# Patient Record
Sex: Female | Born: 1995 | Race: Black or African American | Hispanic: No | Marital: Single | Smoking: Former smoker
Health system: Southern US, Community
[De-identification: ages and names within clinical notes are randomized; demographics above are authoritative.]

## PROBLEM LIST (undated history)

## (undated) DIAGNOSIS — J45909 Unspecified asthma, uncomplicated: Secondary | ICD-10-CM

## (undated) DIAGNOSIS — L309 Dermatitis, unspecified: Secondary | ICD-10-CM

## (undated) DIAGNOSIS — J302 Other seasonal allergic rhinitis: Secondary | ICD-10-CM

## (undated) HISTORY — PX: WISDOM TOOTH EXTRACTION: SHX21

---
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
## 2013-06-03 ENCOUNTER — Other Ambulatory Visit: Payer: Self-pay | Admitting: *Deleted

## 2013-06-03 MED ORDER — DESOGESTREL-ETHINYL ESTRADIOL 0.15-0.02/0.01 MG (21/5) PO TABS: 1.0000 | ORAL_TABLET | Freq: Every day | ORAL | Status: DC

## 2013-06-03 NOTE — Telephone Encounter (Signed)
Last AEX and refill 04/02/12 #3 mos/ 3 refills No future appt scheduled.  Will refill once. Patient will need AEX for further refills .

## 2013-08-01 ENCOUNTER — Ambulatory Visit: Payer: Self-pay | Admitting: Nurse Practitioner

## 2013-09-24 ENCOUNTER — Encounter: Payer: Self-pay | Admitting: Nurse Practitioner

## 2013-09-25 ENCOUNTER — Other Ambulatory Visit: Payer: Self-pay | Admitting: *Deleted

## 2013-09-25 NOTE — Telephone Encounter (Signed)
eScribe request from [facility] for refill on VIORELE Last filled - 06/03/13 X 1 PACK Last AEX - 04/02/12 Next AEX - pt DNKA on 09/24/13.  Pt did not reschedule.  Message given with 06/03/13 refill that AEX will be needed for more refills. RX denied.

## 2013-09-27 ENCOUNTER — Other Ambulatory Visit: Payer: Self-pay

## 2013-09-27 NOTE — Telephone Encounter (Signed)
Last AEX: 04/02/12 Last refill:06/03/13 1 pack, 0 refs Current AEX: not scheduled  Encounter 06/03/13 states no refills until AEX. Pt had a No Show on 09/24/13 with Rock NephewPatti Grubb  Rx denied

## 2013-09-30 NOTE — Progress Notes (Signed)
This encounter was created in error - please disregard.

## 2013-11-11 ENCOUNTER — Encounter: Payer: Self-pay | Admitting: Nurse Practitioner

## 2013-11-11 ENCOUNTER — Ambulatory Visit (INDEPENDENT_AMBULATORY_CARE_PROVIDER_SITE_OTHER): Payer: Managed Care, Other (non HMO) | Admitting: Nurse Practitioner

## 2013-11-11 VITALS — BP 122/78 | HR 108 | Ht 61.5 in | Wt 190.0 lb

## 2013-11-11 DIAGNOSIS — Z113 Encounter for screening for infections with a predominantly sexual mode of transmission: Secondary | ICD-10-CM

## 2013-11-11 DIAGNOSIS — Z01419 Encounter for gynecological examination (general) (routine) without abnormal findings: Secondary | ICD-10-CM

## 2013-11-11 DIAGNOSIS — Z0000 Encounter for general adult medical examination without abnormal findings: Secondary | ICD-10-CM

## 2013-11-11 LAB — POCT URINALYSIS DIPSTICK
Bilirubin, UA: NEGATIVE
Blood, UA: NEGATIVE
Glucose, UA: NEGATIVE
Ketones, UA: NEGATIVE
Leukocytes, UA: NEGATIVE
NITRITE UA: NEGATIVE
Protein, UA: NEGATIVE
Urobilinogen, UA: NEGATIVE
pH, UA: 7

## 2013-11-11 MED ORDER — DESOGESTREL-ETHINYL ESTRADIOL 0.15-0.02/0.01 MG (21/5) PO TABS: 1.0000 | ORAL_TABLET | Freq: Every day | ORAL | Status: DC

## 2013-11-11 NOTE — Patient Instructions (Signed)
General topics  Next pap or exam is  due in 1 year Take a Women's multivitamin Take 1200 mg. of calcium daily - prefer dietary If any concerns in interim to call back  Breast Self-Awareness Practicing breast self-awareness may pick up problems early, prevent significant medical complications, and possibly save your life. By practicing breast self-awareness, you can become familiar with how your breasts look and feel and if your breasts are changing. This allows you to notice changes early. It can also offer you some reassurance that your breast health is good. One way to learn what is normal for your breasts and whether your breasts are changing is to do a breast self-exam. If you find a lump or something that was not present in the past, it is best to contact your caregiver right away. Other findings that should be evaluated by your caregiver include nipple discharge, especially if it is bloody; skin changes or reddening; areas where the skin seems to be pulled in (retracted); or new lumps and bumps. Breast pain is seldom associated with cancer (malignancy), but should also be evaluated by a caregiver. BREAST SELF-EXAM The best time to examine your breasts is 5 7 days after your menstrual period is over.  ExitCare Patient Information 2013 [employer].   Exercise to Stay Healthy Exercise helps you become and stay healthy. EXERCISE IDEAS AND TIPS Choose exercises that:  You enjoy.  Fit into your day. You do not need to exercise really hard to be healthy. You can do exercises at a slow or medium level and stay healthy. You can:  Stretch before and after working out.  Try yoga, Pilates, or tai chi.  Lift weights.  Walk fast, swim, jog, run, climb stairs, bicycle, dance, or rollerskate.  Take aerobic classes. Exercises that burn about 150 calories:  Running 1  miles in 15 minutes.  Playing volleyball for 45 to 60 minutes.  Washing and waxing a car for 45 to 60  minutes.  Playing touch football for 45 minutes.  Walking 1  miles in 35 minutes.  Pushing a stroller 1  miles in 30 minutes.  Playing basketball for 30 minutes.  Raking leaves for 30 minutes.  Bicycling 5 miles in 30 minutes.  Walking 2 miles in 30 minutes.  Dancing for 30 minutes.  Shoveling snow for 15 minutes.  Swimming laps for 20 minutes.  Walking up stairs for 15 minutes.  Bicycling 4 miles in 15 minutes.  Gardening for 30 to 45 minutes.  Jumping rope for 15 minutes.  Washing windows or floors for 45 to 60 minutes. Document Released: 03/12/2010 Document Revised: 05/02/2011 Document Reviewed: 03/12/2010 ExitCare Patient Information 2013 [employer].   Other topics ( that may be useful information):    Sexually Transmitted Disease Sexually transmitted disease (STD) refers to any infection that is passed from person to person during sexual activity. This may happen by way of saliva, semen, blood, vaginal mucus, or urine. Common STDs include:  Gonorrhea.  Chlamydia.  Syphilis.  HIV/AIDS.  Genital herpes.  Hepatitis B and C.  Trichomonas.  Human papillomavirus (HPV).  Pubic lice. CAUSES  An STD may be spread by bacteria, virus, or parasite. A person can get an STD by:  Sexual intercourse with an infected person.  Sharing sex toys with an infected person.  Sharing needles with an infected person.  Having intimate contact with the genitals, mouth, or rectal areas of an infected person. SYMPTOMS  Some people may not have any symptoms, but   they can still pass the infection to others. Different STDs have different symptoms. Symptoms include:  Painful or bloody urination.  Pain in the pelvis, abdomen, vagina, anus, throat, or eyes.  Skin rash, itching, irritation, growths, or sores (lesions). These usually occur in the genital or anal area.  Abnormal vaginal discharge.  Penile discharge in men.  Soft, flesh-colored skin growths in the  genital or anal area.  Fever.  Pain or bleeding during sexual intercourse.  Swollen glands in the groin area.  Yellow skin and eyes (jaundice). This is seen with hepatitis. DIAGNOSIS  To make a diagnosis, your caregiver may:  Take a medical history.  Perform a physical exam.  Take a specimen (culture) to be examined.  Examine a sample of discharge under a microscope.  Perform blood test TREATMENT   Chlamydia, gonorrhea, trichomonas, and syphilis can be cured with antibiotic medicine.  Genital herpes, hepatitis, and HIV can be treated, but not cured, with prescribed medicines. The medicines will lessen the symptoms.  Genital warts from HPV can be treated with medicine or by freezing, burning (electrocautery), or surgery. Warts may come back.  HPV is a virus and cannot be cured with medicine or surgery.However, abnormal areas may be followed very closely by your caregiver and may be removed from the cervix, vagina, or vulva through office procedures or surgery. If your diagnosis is confirmed, your recent sexual partners need treatment. This is true even if they are symptom-free or have a negative culture or evaluation. They should not have sex until their caregiver says it is okay. HOME CARE INSTRUCTIONS  All sexual partners should be informed, tested, and treated for all STDs.  Take your antibiotics as directed. Finish them even if you start to feel better.  Only take over-the-counter or prescription medicines for pain, discomfort, or fever as directed by your caregiver.  Rest.  Eat a balanced diet and drink enough fluids to keep your urine clear or pale yellow.  Do not have sex until treatment is completed and you have followed up with your caregiver. STDs should be checked after treatment.  Keep all follow-up appointments, Pap tests, and blood tests as directed by your caregiver.  Only use latex condoms and water-soluble lubricants during sexual activity. Do not use  petroleum jelly or oils.  Avoid alcohol and illegal drugs.  Get vaccinated for HPV and hepatitis. If you have not received these vaccines in the past, talk to your caregiver about whether one or both might be right for you.  Avoid risky sex practices that can break the skin. The only way to avoid getting an STD is to avoid all sexual activity.Latex condoms and dental dams (for oral sex) will help lessen the risk of getting an STD, but will not completely eliminate the risk. SEEK MEDICAL CARE IF:   You have a fever.  You have any new or worsening symptoms. Document Released: 04/30/2002 Document Revised: 05/02/2011 Document Reviewed: 05/07/2010 [facility] Patient Information 2013 [employer].    Domestic Abuse You are being battered or abused if someone close to you hits, pushes, or physically hurts you in any way. You also are being abused if you are forced into activities. You are being sexually abused if you are forced to have sexual contact of any kind. You are being emotionally abused if you are made to feel worthless or if you are constantly threatened. It is important to remember that help is available. No one has the right to abuse you. PREVENTION OF FURTHER  ABUSE  Learn the warning signs of danger. This varies with situations but may include: the use of alcohol, threats, isolation from friends and family, or forced sexual contact. Leave if you feel that violence is going to occur.  If you are attacked or beaten, report it to the police so the abuse is documented. You do not have to press charges. The police can protect you while you or the attackers are leaving. Get the officer's name and badge number and a copy of the report.  Find someone you can trust and tell them what is happening to you: your caregiver, a nurse, clergy member, close friend or family member. Feeling ashamed is natural, but remember that you have done nothing wrong. No one deserves abuse. Document Released:  02/05/2000 Document Revised: 05/02/2011 Document Reviewed: 04/15/2010 ExitCare Patient Information 2013 [employer].    How Much is Too Much Alcohol? Drinking too much alcohol can cause injury, accidents, and health problems. These types of problems can include:   Car crashes.  Falls.  Family fighting (domestic violence).  Drowning.  Fights.  Injuries.  Burns.  Damage to certain organs.  Having a baby with birth defects. ONE DRINK CAN BE TOO MUCH WHEN YOU ARE:  Working.  Pregnant or breastfeeding.  Taking medicines. Ask your doctor.  Driving or planning to drive. If you or someone you know has a drinking problem, get help from a doctor.  Document Released: 12/04/2008 Document Revised: 05/02/2011 Document Reviewed: 12/04/2008 ExitCare Patient Information 2013 [employer].   Smoking Hazards Smoking cigarettes is extremely bad for your health. Tobacco smoke has over 200 known poisons in it. There are over 60 chemicals in tobacco smoke that cause cancer. Some of the chemicals found in cigarette smoke include:   Cyanide.  Benzene.  Formaldehyde.  Methanol (wood alcohol).  Acetylene (fuel used in welding torches).  Ammonia. Cigarette smoke also contains the poisonous gases nitrogen oxide and carbon monoxide.  Cigarette smokers have an increased risk of many serious medical problems and Smoking causes approximately:  90% of all lung cancer deaths in men.  80% of all lung cancer deaths in women.  90% of deaths from chronic obstructive lung disease. Compared with nonsmokers, smoking increases the risk of:  Coronary heart disease by 2 to 4 times.  Stroke by 2 to 4 times.  Men developing lung cancer by 23 times.  Women developing lung cancer by 13 times.  Dying from chronic obstructive lung diseases by 12 times.  . Smoking is the most preventable cause of death and disease in our society.  WHY IS SMOKING ADDICTIVE?  Nicotine is the chemical  agent in tobacco that is capable of causing addiction or dependence.  When you smoke and inhale, nicotine is absorbed rapidly into the bloodstream through your lungs. Nicotine absorbed through the lungs is capable of creating a powerful addiction. Both inhaled and non-inhaled nicotine may be addictive.  Addiction studies of cigarettes and spit tobacco show that addiction to nicotine occurs mainly during the teen years, when young people begin using tobacco products. WHAT ARE THE BENEFITS OF QUITTING?  There are many health benefits to quitting smoking.   Likelihood of developing cancer and heart disease decreases. Health improvements are seen almost immediately.  Blood pressure, pulse rate, and breathing patterns start returning to normal soon after quitting. QUITTING SMOKING   American Lung Association - 1-800-LUNGUSA  American Cancer Society - 1-800-ACS-2345 Document Released: 03/17/2004 Document Revised: 05/02/2011 Document Reviewed: 11/19/2008 ExitCare Patient Information 2013 ExitCare,   LLC.   Stress Management Stress is a state of physical or mental tension that often results from changes in your life or normal routine. Some common causes of stress are:  Death of a loved one.  Injuries or severe illnesses.  Getting fired or changing jobs.  Moving into a new home. Other causes may be:  Sexual problems.  Business or financial losses.  Taking on a large debt.  Regular conflict with someone at home or at work.  Constant tiredness from lack of sleep. It is not just bad things that are stressful. It may be stressful to:  Win the lottery.  Get married.  Buy a new car. The amount of stress that can be easily tolerated varies from person to person. Changes generally cause stress, regardless of the types of change. Too much stress can affect your health. It may lead to physical or emotional problems. Too little stress (boredom) may also become stressful. SUGGESTIONS TO  REDUCE STRESS:  Talk things over with your family and friends. It often is helpful to share your concerns and worries. If you feel your problem is serious, you may want to get help from a professional counselor.  Consider your problems one at a time instead of lumping them all together. Trying to take care of everything at once may seem impossible. List all the things you need to do and then start with the most important one. Set a goal to accomplish 2 or 3 things each day. If you expect to do too many in a single day you will naturally fail, causing you to feel even more stressed.  Do not use alcohol or drugs to relieve stress. Although you may feel better for a short time, they do not remove the problems that caused the stress. They can also be habit forming.  Exercise regularly - at least 3 times per week. Physical exercise can help to relieve that "uptight" feeling and will relax you.  The shortest distance between despair and hope is often a good night's sleep.  Go to bed and get up on time allowing yourself time for appointments without being rushed.  Take a short "time-out" period from any stressful situation that occurs during the day. Close your eyes and take some deep breaths. Starting with the muscles in your face, tense them, hold it for a few seconds, then relax. Repeat this with the muscles in your neck, shoulders, hand, stomach, back and legs.  Take good care of yourself. Eat a balanced diet and get plenty of rest.  Schedule time for having fun. Take a break from your daily routine to relax. HOME CARE INSTRUCTIONS   Call if you feel overwhelmed by your problems and feel you can no longer manage them on your own.  Return immediately if you feel like hurting yourself or someone else. Document Released: 08/03/2000 Document Revised: 05/02/2011 Document Reviewed: 03/26/2007 ExitCare Patient Information 2013 ExitCare, [employer].  

## 2013-11-11 NOTE — Progress Notes (Signed)
Patient ID: Kristina Mendoza, female   DOB: [birth year], 18 y.o.   MRN: [id number] 18 y.o. G0 Single African American Fe here for annual exam. Has been on OCP until this last month.  Ran out and will restart after next menses.  Menses for 3-4 days on OCP.  Menses off the pill is 4-5 days and increase in cramps.  Not SA since January. When wearing a sports bra she has pain in upper shoulders and neck area.  She will try to change types of sports bra.  She is working on weight loss as well to help reduce bust size.  She is size 'H'.  Patient's last menstrual period was 10/24/2013.          Sexually active: Yes.   Not currently The current method of family planning is OCP (estrogen/progesterone) and abstinence.    Exercising: Yes.    Gym/ health club routine includes cardio everyday.  On dance team at school. Smoker:  no  Health Maintenance: Pap:  never TDaP:  08/26/2005 Gardasil:  Completed 06/2011 Labs: HB:  14.1  Urine:  Negative    reports that she has never smoked. She has never used smokeless tobacco. She reports that she does not drink alcohol or use illicit drugs.  History reviewed. No pertinent past medical history.  History reviewed. No pertinent past surgical history.  Current Outpatient Prescriptions  Medication Sig Dispense Refill  . clobetasol ointment (TEMOVATE) 0.05 % Apply topically as directed.      . desogestrel-ethinyl estradiol (VIORELE) 0.15-0.02/0.01 MG (21/5) tablet Take 1 tablet by mouth daily.  1 Package  0   No current facility-administered medications for this visit.    Family History  Problem Relation Age of Onset  . Colon cancer Mother   . Colon cancer Maternal Grandmother   . Heart disease Maternal Grandmother   . Dementia Paternal Grandmother   . Deafness Paternal Grandmother   . Alzheimer's disease Other     ROS:  Pertinent items are noted in HPI.  Otherwise, a comprehensive ROS was negative.  Exam:   BP 122/78  Pulse 108  Ht 5' 1.5" (1.562 m)  Wt  190 lb (86.183 kg)  BMI 35.32 kg/m2  LMP 10/24/2013 Height: 5' 1.5" (156.2 cm)  Ht Readings from Last 3 Encounters:  11/11/13 5' 1.5" (1.562 m)    General appearance: alert, cooperative and appears stated age Head: Normocephalic, without obvious abnormality, atraumatic Neck: no adenopathy, supple, symmetrical, trachea midline and thyroid normal to inspection and palpation Lungs: clear to auscultation bilaterally Breasts: normal appearance, no masses or tenderness Heart: regular rate and rhythm Abdomen: soft, non-tender; no masses,  no organomegaly Extremities: extremities normal, atraumatic, no cyanosis or edema Skin: Skin color, texture, turgor normal. No rashes or lesions Lymph nodes: Cervical, supraclavicular, and axillary nodes normal. No abnormal inguinal nodes palpated Neurologic: Grossly normal   Pelvic: External genitalia:  no lesions              Urethra:  normal appearing urethra with no masses, tenderness or lesions              Bartholin's and Skene's: normal                 Vagina: normal appearing vagina with normal color and discharge, no lesions              Cervix: anteverted              Pap taken: No. Bimanual Exam:  Uterus:  normal size, contour, position, consistency, mobility, non-tender              Adnexa: no mass, fullness, tenderness               Rectovaginal: Confirms               Anus:  normal sphincter tone, no lesions  A:  Well Woman with normal exam  Contraception  R/O STD's  FMH: colon cancer  P:   Reviewed health and wellness pertinent to exam  Pap smear not taken today  Refill on OCP for a year  Follow up on STD's  Counseled on breast self exam, STD prevention, HIV risk factors and prevention, use and side effects of OCP's, adequate intake of calcium and vitamin D, diet and exercise return annually or prn  An After Visit Summary was printed and given to the patient.

## 2013-11-12 LAB — HEMOGLOBIN, FINGERSTICK: HEMOGLOBIN, FINGERSTICK: 14.1 g/dL (ref 12.0–16.0)

## 2013-11-12 LAB — STD PANEL
HIV 1&2 Ab, 4th Generation: NONREACTIVE
Hepatitis B Surface Ag: NEGATIVE

## 2013-11-12 NOTE — Progress Notes (Signed)
Encounter reviewed by Dr. Brook Silva.  

## 2013-11-13 LAB — IPS N GONORRHOEA AND CHLAMYDIA BY PCR

## 2014-06-30 ENCOUNTER — Other Ambulatory Visit: Payer: Self-pay | Admitting: Nurse Practitioner

## 2014-06-30 MED ORDER — DESOGESTREL-ETHINYL ESTRADIOL 0.15-0.02/0.01 MG (21/5) PO TABS: 1.0000 | ORAL_TABLET | Freq: Every day | ORAL | Status: DC

## 2014-06-30 NOTE — Telephone Encounter (Signed)
Patient notified that rx has been sent in. 

## 2014-06-30 NOTE — Telephone Encounter (Signed)
Medication refill request: Viorele  Last AEX:  11/11/13 with PG  Next AEX: 11/18/14 with PG Last MMG (if hormonal medication request): N/A Refill authorized:  Please advise.  I called pharmacy s/w pharmacist patient would have to pay out of pocket if she were to get her 3 packs which is what is remaining. We could send in one pack but then insurance wouldn't cover her the next time she gets 3 packs. Ms. Eunice BlaseDebbie can you send in new rx with refills to last patient until AEX?  Please advise.

## 2014-06-30 NOTE — Telephone Encounter (Signed)
Pt states she misplaced her pack of birth control pills and would like another pack called in.  Ph[facility]: [facility]

## 2014-10-24 ENCOUNTER — Other Ambulatory Visit: Payer: Self-pay | Admitting: Certified Nurse Midwife

## 2014-10-28 NOTE — Telephone Encounter (Signed)
Medication refill request: Viorele Last AEX:  11-11-13 Next AEX: 11-18-14 Last MMG (if hormonal medication request): N/A Refill authorized: please advise

## 2014-10-28 NOTE — Telephone Encounter (Signed)
RX request was sent to [facility] this AM- since is she of fI'll forward to Dr. Hyacinth Meeker

## 2014-10-28 NOTE — Telephone Encounter (Signed)
Patient calling requesting a refill on her birth control. Pharmacy on file is correct. She requests this "as soon as possible."

## 2014-11-18 ENCOUNTER — Encounter: Payer: Self-pay | Admitting: Nurse Practitioner

## 2014-11-18 ENCOUNTER — Ambulatory Visit (INDEPENDENT_AMBULATORY_CARE_PROVIDER_SITE_OTHER): Payer: Managed Care, Other (non HMO) | Admitting: Nurse Practitioner

## 2014-11-18 VITALS — BP 124/82 | HR 84 | Resp 16 | Ht 62.0 in | Wt 205.0 lb

## 2014-11-18 DIAGNOSIS — Z01419 Encounter for gynecological examination (general) (routine) without abnormal findings: Secondary | ICD-10-CM

## 2014-11-18 DIAGNOSIS — Z0000 Encounter for general adult medical examination without abnormal findings: Secondary | ICD-10-CM | POA: Diagnosis not present

## 2014-11-18 DIAGNOSIS — Z113 Encounter for screening for infections with a predominantly sexual mode of transmission: Secondary | ICD-10-CM | POA: Diagnosis not present

## 2014-11-18 NOTE — Patient Instructions (Signed)
General topics  Next pap or exam is  due in 1 year Take a Women's multivitamin Take 1200 mg. of calcium daily - prefer dietary If any concerns in interim to call back  Breast Self-Awareness Practicing breast self-awareness may pick up problems early, prevent significant medical complications, and possibly save your life. By practicing breast self-awareness, you can become familiar with how your breasts look and feel and if your breasts are changing. This allows you to notice changes early. It can also offer you some reassurance that your breast health is good. One way to learn what is normal for your breasts and whether your breasts are changing is to do a breast self-exam. If you find a lump or something that was not present in the past, it is best to contact your caregiver right away. Other findings that should be evaluated by your caregiver include nipple discharge, especially if it is bloody; skin changes or reddening; areas where the skin seems to be pulled in (retracted); or new lumps and bumps. Breast pain is seldom associated with cancer (malignancy), but should also be evaluated by a caregiver. BREAST SELF-EXAM The best time to examine your breasts is 5 7 days after your menstrual period is over.  ExitCare Patient Information 2013 [employer].   Exercise to Stay Healthy Exercise helps you become and stay healthy. EXERCISE IDEAS AND TIPS Choose exercises that:  You enjoy.  Fit into your day. You do not need to exercise really hard to be healthy. You can do exercises at a slow or medium level and stay healthy. You can:  Stretch before and after working out.  Try yoga, Pilates, or tai chi.  Lift weights.  Walk fast, swim, jog, run, climb stairs, bicycle, dance, or rollerskate.  Take aerobic classes. Exercises that burn about 150 calories:  Running 1  miles in 15 minutes.  Playing volleyball for 45 to 60 minutes.  Washing and waxing a car for 45 to 60  minutes.  Playing touch football for 45 minutes.  Walking 1  miles in 35 minutes.  Pushing a stroller 1  miles in 30 minutes.  Playing basketball for 30 minutes.  Raking leaves for 30 minutes.  Bicycling 5 miles in 30 minutes.  Walking 2 miles in 30 minutes.  Dancing for 30 minutes.  Shoveling snow for 15 minutes.  Swimming laps for 20 minutes.  Walking up stairs for 15 minutes.  Bicycling 4 miles in 15 minutes.  Gardening for 30 to 45 minutes.  Jumping rope for 15 minutes.  Washing windows or floors for 45 to 60 minutes. Document Released: 03/12/2010 Document Revised: 05/02/2011 Document Reviewed: 03/12/2010 ExitCare Patient Information 2013 [employer].   Other topics ( that may be useful information):    Sexually Transmitted Disease Sexually transmitted disease (STD) refers to any infection that is passed from person to person during sexual activity. This may happen by way of saliva, semen, blood, vaginal mucus, or urine. Common STDs include:  Gonorrhea.  Chlamydia.  Syphilis.  HIV/AIDS.  Genital herpes.  Hepatitis B and C.  Trichomonas.  Human papillomavirus (HPV).  Pubic lice. CAUSES  An STD may be spread by bacteria, virus, or parasite. A person can get an STD by:  Sexual intercourse with an infected person.  Sharing sex toys with an infected person.  Sharing needles with an infected person.  Having intimate contact with the genitals, mouth, or rectal areas of an infected person. SYMPTOMS  Some people may not have any symptoms, but   they can still pass the infection to others. Different STDs have different symptoms. Symptoms include:  Painful or bloody urination.  Pain in the pelvis, abdomen, vagina, anus, throat, or eyes.  Skin rash, itching, irritation, growths, or sores (lesions). These usually occur in the genital or anal area.  Abnormal vaginal discharge.  Penile discharge in men.  Soft, flesh-colored skin growths in the  genital or anal area.  Fever.  Pain or bleeding during sexual intercourse.  Swollen glands in the groin area.  Yellow skin and eyes (jaundice). This is seen with hepatitis. DIAGNOSIS  To make a diagnosis, your caregiver may:  Take a medical history.  Perform a physical exam.  Take a specimen (culture) to be examined.  Examine a sample of discharge under a microscope.  Perform blood test TREATMENT   Chlamydia, gonorrhea, trichomonas, and syphilis can be cured with antibiotic medicine.  Genital herpes, hepatitis, and HIV can be treated, but not cured, with prescribed medicines. The medicines will lessen the symptoms.  Genital warts from HPV can be treated with medicine or by freezing, burning (electrocautery), or surgery. Warts may come back.  HPV is a virus and cannot be cured with medicine or surgery.However, abnormal areas may be followed very closely by your caregiver and may be removed from the cervix, vagina, or vulva through office procedures or surgery. If your diagnosis is confirmed, your recent sexual partners need treatment. This is true even if they are symptom-free or have a negative culture or evaluation. They should not have sex until their caregiver says it is okay. HOME CARE INSTRUCTIONS  All sexual partners should be informed, tested, and treated for all STDs.  Take your antibiotics as directed. Finish them even if you start to feel better.  Only take over-the-counter or prescription medicines for pain, discomfort, or fever as directed by your caregiver.  Rest.  Eat a balanced diet and drink enough fluids to keep your urine clear or pale yellow.  Do not have sex until treatment is completed and you have followed up with your caregiver. STDs should be checked after treatment.  Keep all follow-up appointments, Pap tests, and blood tests as directed by your caregiver.  Only use latex condoms and water-soluble lubricants during sexual activity. Do not use  petroleum jelly or oils.  Avoid alcohol and illegal drugs.  Get vaccinated for HPV and hepatitis. If you have not received these vaccines in the past, talk to your caregiver about whether one or both might be right for you.  Avoid risky sex practices that can break the skin. The only way to avoid getting an STD is to avoid all sexual activity.Latex condoms and dental dams (for oral sex) will help lessen the risk of getting an STD, but will not completely eliminate the risk. SEEK MEDICAL CARE IF:   You have a fever.  You have any new or worsening symptoms. Document Released: 04/30/2002 Document Revised: 05/02/2011 Document Reviewed: 05/07/2010 [facility] Patient Information 2013 [employer].    Domestic Abuse You are being battered or abused if someone close to you hits, pushes, or physically hurts you in any way. You also are being abused if you are forced into activities. You are being sexually abused if you are forced to have sexual contact of any kind. You are being emotionally abused if you are made to feel worthless or if you are constantly threatened. It is important to remember that help is available. No one has the right to abuse you. PREVENTION OF FURTHER  ABUSE  Learn the warning signs of danger. This varies with situations but may include: the use of alcohol, threats, isolation from friends and family, or forced sexual contact. Leave if you feel that violence is going to occur.  If you are attacked or beaten, report it to the police so the abuse is documented. You do not have to press charges. The police can protect you while you or the attackers are leaving. Get the officer's name and badge number and a copy of the report.  Find someone you can trust and tell them what is happening to you: your caregiver, a nurse, clergy member, close friend or family member. Feeling ashamed is natural, but remember that you have done nothing wrong. No one deserves abuse. Document Released:  02/05/2000 Document Revised: 05/02/2011 Document Reviewed: 04/15/2010 ExitCare Patient Information 2013 [employer].    How Much is Too Much Alcohol? Drinking too much alcohol can cause injury, accidents, and health problems. These types of problems can include:   Car crashes.  Falls.  Family fighting (domestic violence).  Drowning.  Fights.  Injuries.  Burns.  Damage to certain organs.  Having a baby with birth defects. ONE DRINK CAN BE TOO MUCH WHEN YOU ARE:  Working.  Pregnant or breastfeeding.  Taking medicines. Ask your doctor.  Driving or planning to drive. If you or someone you know has a drinking problem, get help from a doctor.  Document Released: 12/04/2008 Document Revised: 05/02/2011 Document Reviewed: 12/04/2008 ExitCare Patient Information 2013 [employer].   Smoking Hazards Smoking cigarettes is extremely bad for your health. Tobacco smoke has over 200 known poisons in it. There are over 60 chemicals in tobacco smoke that cause cancer. Some of the chemicals found in cigarette smoke include:   Cyanide.  Benzene.  Formaldehyde.  Methanol (wood alcohol).  Acetylene (fuel used in welding torches).  Ammonia. Cigarette smoke also contains the poisonous gases nitrogen oxide and carbon monoxide.  Cigarette smokers have an increased risk of many serious medical problems and Smoking causes approximately:  90% of all lung cancer deaths in men.  80% of all lung cancer deaths in women.  90% of deaths from chronic obstructive lung disease. Compared with nonsmokers, smoking increases the risk of:  Coronary heart disease by 2 to 4 times.  Stroke by 2 to 4 times.  Men developing lung cancer by 23 times.  Women developing lung cancer by 13 times.  Dying from chronic obstructive lung diseases by 12 times.  . Smoking is the most preventable cause of death and disease in our society.  WHY IS SMOKING ADDICTIVE?  Nicotine is the chemical  agent in tobacco that is capable of causing addiction or dependence.  When you smoke and inhale, nicotine is absorbed rapidly into the bloodstream through your lungs. Nicotine absorbed through the lungs is capable of creating a powerful addiction. Both inhaled and non-inhaled nicotine may be addictive.  Addiction studies of cigarettes and spit tobacco show that addiction to nicotine occurs mainly during the teen years, when young people begin using tobacco products. WHAT ARE THE BENEFITS OF QUITTING?  There are many health benefits to quitting smoking.   Likelihood of developing cancer and heart disease decreases. Health improvements are seen almost immediately.  Blood pressure, pulse rate, and breathing patterns start returning to normal soon after quitting. QUITTING SMOKING   American Lung Association - 1-800-LUNGUSA  American Cancer Society - 1-800-ACS-2345 Document Released: 03/17/2004 Document Revised: 05/02/2011 Document Reviewed: 11/19/2008 ExitCare Patient Information 2013 ExitCare,   LLC.   Stress Management Stress is a state of physical or mental tension that often results from changes in your life or normal routine. Some common causes of stress are:  Death of a loved one.  Injuries or severe illnesses.  Getting fired or changing jobs.  Moving into a new home. Other causes may be:  Sexual problems.  Business or financial losses.  Taking on a large debt.  Regular conflict with someone at home or at work.  Constant tiredness from lack of sleep. It is not just bad things that are stressful. It may be stressful to:  Win the lottery.  Get married.  Buy a new car. The amount of stress that can be easily tolerated varies from person to person. Changes generally cause stress, regardless of the types of change. Too much stress can affect your health. It may lead to physical or emotional problems. Too little stress (boredom) may also become stressful. SUGGESTIONS TO  REDUCE STRESS:  Talk things over with your family and friends. It often is helpful to share your concerns and worries. If you feel your problem is serious, you may want to get help from a professional counselor.  Consider your problems one at a time instead of lumping them all together. Trying to take care of everything at once may seem impossible. List all the things you need to do and then start with the most important one. Set a goal to accomplish 2 or 3 things each day. If you expect to do too many in a single day you will naturally fail, causing you to feel even more stressed.  Do not use alcohol or drugs to relieve stress. Although you may feel better for a short time, they do not remove the problems that caused the stress. They can also be habit forming.  Exercise regularly - at least 3 times per week. Physical exercise can help to relieve that "uptight" feeling and will relax you.  The shortest distance between despair and hope is often a good night's sleep.  Go to bed and get up on time allowing yourself time for appointments without being rushed.  Take a short "time-out" period from any stressful situation that occurs during the day. Close your eyes and take some deep breaths. Starting with the muscles in your face, tense them, hold it for a few seconds, then relax. Repeat this with the muscles in your neck, shoulders, hand, stomach, back and legs.  Take good care of yourself. Eat a balanced diet and get plenty of rest.  Schedule time for having fun. Take a break from your daily routine to relax. HOME CARE INSTRUCTIONS   Call if you feel overwhelmed by your problems and feel you can no longer manage them on your own.  Return immediately if you feel like hurting yourself or someone else. Document Released: 08/03/2000 Document Revised: 05/02/2011 Document Reviewed: 03/26/2007 ExitCare Patient Information 2013 ExitCare, [employer].  

## 2014-11-18 NOTE — Progress Notes (Signed)
Patient ID: Kristina Mendoza, female   DOB: [birth year], 19 y.o.   MRN: [id number] 19 y.o. G0P0 Single  African American Fe here for annual exam. Menses 3-4 days.  Slight cramps.  Recent change in partner.  She is currently on OCP for contraception and wants to change to Nexplanon.  Her friend uses this and is pleased.  Patient's last menstrual period was 10/18/2014.          Sexually active: Yes.    The current method of family planning is OCP (estrogen/progesterone).    Exercising: Yes.    moderate- intensity Smoker:  no  Health Maintenance: Pap:  Never TDaP:  08-26-05 Gardasil completed: 2014 Labs: to be done here   reports that she has never smoked. She has never used smokeless tobacco. She reports that she drinks about 0.6 oz of alcohol per week. She reports that she does not use illicit drugs.  History reviewed. No pertinent past medical history.  History reviewed. No pertinent past surgical history.  Current Outpatient Prescriptions  Medication Sig Dispense Refill  . VIORELE 0.15-0.02/0.01 MG (21/5) tablet TAKE 1 TABLET BY MOUTH DAILY. 84 tablet 0   No current facility-administered medications for this visit.    Family History  Problem Relation Age of Onset  . Colon cancer Mother 47  . Colon cancer Maternal Grandmother 37  . Heart disease Maternal Grandmother   . Dementia Paternal Grandmother   . Deafness Paternal Grandmother   . Alzheimer's disease Other     ROS:  Pertinent items are noted in HPI.  Otherwise, a comprehensive ROS was negative.  Exam:   BP 124/82 mmHg  Pulse 84  Resp 16  Ht  (1.575 m)  Wt 205 lb (92.987 kg)  BMI 37.49 kg/m2  LMP 10/18/2014 Height:  (157.5 cm) Ht Readings from Last 3 Encounters:  11/18/14  (1.575 m)  11/11/13 5' 1.5" (1.562 m)    General appearance: alert, cooperative and appears stated age Head: Normocephalic, without obvious abnormality, atraumatic Neck: no adenopathy, supple, symmetrical, trachea midline and  thyroid normal to inspection and palpation Lungs: clear to auscultation bilaterally Breasts: normal appearance, no masses or tenderness Heart: regular rate and rhythm Abdomen: soft, non-tender; no masses,  no organomegaly Extremities: extremities normal, atraumatic, no cyanosis or edema Skin: Skin color, texture, turgor normal. No rashes or lesions Lymph nodes: Cervical, supraclavicular, and axillary nodes normal. No abnormal inguinal nodes palpated Neurologic: Grossly normal   Pelvic: External genitalia:  no lesions              Urethra:  normal appearing urethra with no masses, tenderness or lesions              Bartholin's and Skene's: normal                 Vagina: normal appearing vagina with normal color and discharge, no lesions              Cervix: anteverted              Pap taken: Yes.   Bimanual Exam:  Uterus:  normal size, contour, position, consistency, mobility, non-tender              Adnexa: no mass, fullness, tenderness               Rectovaginal: Confirms               Anus:  normal sphincter tone, no lesions  Chaperone present: yes  A:  Well Woman with normal exam  Contraceptive needs  R/O STD's  P:   Reviewed health and wellness pertinent to exam  Pap smear as above (first pap)  No refill of OCP is given as she has 3 months now.  She is given information for Nexplanon and papers to call insurance company and verify coverage.  She will need MD apt. before insertion to discuss and review.  Counseled on breast self exam, use and side effects of OCP's, adequate intake of calcium and vitamin D, diet and exercise return annually or prn  An After Visit Summary was printed and given to the patient.

## 2014-11-19 LAB — STD PANEL
HIV 1&2 Ab, 4th Generation: NONREACTIVE
Hepatitis B Surface Ag: NEGATIVE

## 2014-11-19 LAB — IPS PAP TEST WITH REFLEX TO HPV

## 2014-11-20 LAB — IPS N GONORRHOEA AND CHLAMYDIA BY PCR

## 2014-11-20 NOTE — Progress Notes (Signed)
Encounter reviewed by Dr. Brook Amundson C. Silva.  

## 2015-01-06 ENCOUNTER — Other Ambulatory Visit: Payer: Self-pay | Admitting: Nurse Practitioner

## 2015-01-06 MED ORDER — DESOGESTREL-ETHINYL ESTRADIOL 0.15-0.02/0.01 MG (21/5) PO TABS: 1.0000 | ORAL_TABLET | Freq: Every day | ORAL | Status: DC

## 2015-01-06 NOTE — Telephone Encounter (Signed)
Requesting refill for Viorele  To [facility] on [address] at [phone].

## 2015-01-06 NOTE — Telephone Encounter (Signed)
Medication refill request: Viorele Last AEX:  11-18-14 Next AEX: 11-24-15 Last MMG (if hormonal medication request): N/A Refill authorized: please advise

## 2015-06-24 ENCOUNTER — Emergency Department (HOSPITAL_COMMUNITY)
Admission: EM | Admit: 2015-06-24 | Discharge: 2015-06-25 | Disposition: A | Discharge status: Home / Self Care | Payer: Managed Care, Other (non HMO) | Attending: Emergency Medicine | Admitting: Emergency Medicine

## 2015-06-24 ENCOUNTER — Encounter (HOSPITAL_COMMUNITY): Payer: Self-pay | Admitting: Emergency Medicine

## 2015-06-24 DIAGNOSIS — Z793 Long term (current) use of hormonal contraceptives: Secondary | ICD-10-CM | POA: Insufficient documentation

## 2015-06-24 DIAGNOSIS — R0602 Shortness of breath: Secondary | ICD-10-CM | POA: Diagnosis present

## 2015-06-24 DIAGNOSIS — Z872 Personal history of diseases of the skin and subcutaneous tissue: Secondary | ICD-10-CM | POA: Insufficient documentation

## 2015-06-24 DIAGNOSIS — Z880 Allergy status to penicillin: Secondary | ICD-10-CM | POA: Insufficient documentation

## 2015-06-24 DIAGNOSIS — R000 Tachycardia, unspecified: Secondary | ICD-10-CM | POA: Insufficient documentation

## 2015-06-24 DIAGNOSIS — J45901 Unspecified asthma with (acute) exacerbation: Secondary | ICD-10-CM

## 2015-06-24 HISTORY — DX: Other seasonal allergic rhinitis: J30.2

## 2015-06-24 HISTORY — DX: Dermatitis, unspecified: L30.9

## 2015-06-24 HISTORY — DX: Unspecified asthma, uncomplicated: J45.909

## 2015-06-24 MED ORDER — IPRATROPIUM BROMIDE 0.02 % IN SOLN
1.0000 mg | Freq: Once | RESPIRATORY_TRACT | Status: AC
  Administered 2015-06-25: 1 mg via RESPIRATORY_TRACT
  Filled 2015-06-24: qty 5

## 2015-06-24 MED ORDER — ALBUTEROL (5 MG/ML) CONTINUOUS INHALATION SOLN
10.0000 mg/h | INHALATION_SOLUTION | RESPIRATORY_TRACT | Status: AC
  Administered 2015-06-25: 10 mg/h via RESPIRATORY_TRACT
  Filled 2015-06-24: qty 20

## 2015-06-24 NOTE — ED Notes (Signed)
Pt states she is having some shortness of breath that started yesterday and then got better but got bad again around 8pm tonight  Pt has asthma and recently had bronchitis  Pt states she has an inhaler but it is outdated

## 2015-06-24 NOTE — ED Provider Notes (Signed)
CSN: [id number]     Arrival date & time 06/24/15  2207 History  By signing my name below, I, Tanda Rockers, attest that this documentation has been prepared under the direction and in the presence of [employer], PA-C. Electronically Signed: Tanda Rockers, ED Scribe. 06/24/2015. 11:58 PM.   Chief Complaint  Patient presents with  . Shortness of Breath   The history is provided by the patient, medical records and a parent. No language interpreter was used.     HPI Comments: Kristina Mendoza is a 20 y.o. female with PMHx asthma, who presents to the Emergency Department complaining of gradual onset, constant, asthma exacerbation including shortness of breath that began at 8 PM tonight (approximately 4 hours ago). Pt has hx of asthma but does not have an inhaler due to it being expired. She reports having seasonal allergies and has been taking Xysal and Flonase. Pt was diagnosed with bronchitis 2 months ago. She has not had steroids recently. Pt has never had to be hospitalized or intubated for her asthma. Denies cough, congestion, fever, chills, or any other associated symptoms.    Past Medical History  Diagnosis Date  . Asthma   . Eczema   . Seasonal allergies    History reviewed. No pertinent past surgical history. Family History  Problem Relation Age of Onset  . Colon cancer Mother 64  . Colon cancer Maternal Grandmother 23  . Heart disease Maternal Grandmother   . Dementia Paternal Grandmother   . Deafness Paternal Grandmother   . Alzheimer's disease Other   . Hypertension Other   . Diabetes Other    Social History  Substance Use Topics  . Smoking status: Never Smoker   . Smokeless tobacco: Never Used  . Alcohol Use: Yes     Comment: occ   OB History    Gravida Para Term Preterm AB TAB SAB Ectopic Multiple Living   0              Review of Systems  Constitutional: Negative for fever, chills, diaphoresis, appetite change, fatigue and unexpected weight change.   HENT: Negative for congestion and mouth sores.   Eyes: Negative for visual disturbance.  Respiratory: Positive for shortness of breath and wheezing. Negative for cough and chest tightness.   Cardiovascular: Negative for chest pain.  Gastrointestinal: Negative for nausea, vomiting, abdominal pain, diarrhea and constipation.  Endocrine: Negative for polydipsia, polyphagia and polyuria.  Genitourinary: Negative for dysuria, urgency, frequency and hematuria.  Musculoskeletal: Negative for back pain and neck stiffness.  Skin: Negative for rash.  Allergic/Immunologic: Negative for immunocompromised state.  Neurological: Negative for syncope, light-headedness and headaches.  Hematological: Does not bruise/bleed easily.  Psychiatric/Behavioral: Negative for sleep disturbance. The patient is not nervous/anxious.       Allergies  Penicillins  Home Medications   Prior to Admission medications   Medication Sig Start Date End Date Taking? Authorizing Provider  desogestrel-ethinyl estradiol (VIORELE) 0.15-0.02/0.01 MG (21/5) tablet Take 1 tablet by mouth daily. 01/06/15   Ria Comment, FNP  predniSONE (DELTASONE) 20 MG tablet Take 2 tablets (40 mg total) by mouth daily. 06/25/15   Keynan Heffern, PA-C   BP 149/110 mmHg  Pulse 106  Temp(Src) 98.5 F (36.9 C) (Oral)  Resp 20  SpO2 98%  LMP 06/03/2015 (Exact Date)   Physical Exam  Constitutional: She appears well-developed and well-nourished. No distress.  Awake, alert, nontoxic appearance  HENT:  Head: Normocephalic and atraumatic.  Mouth/Throat: Oropharynx is clear and moist. No oropharyngeal  exudate.  Eyes: Conjunctivae are normal. No scleral icterus.  Neck: Normal range of motion. Neck supple.  Cardiovascular: Regular rhythm and intact distal pulses.  Tachycardia present.   Pulmonary/Chest: Accessory muscle usage present. Tachypnea noted. She is in respiratory distress. She has decreased breath sounds. She has wheezes. She has  no rhonchi. She has no rales.  Equal chest expansion Very diminished breath sounds with inspiratory and expiratory wheezes Accessory muscle usage  Abdominal: Soft. Bowel sounds are normal. She exhibits no mass. There is no tenderness. There is no rebound and no guarding.  Musculoskeletal: Normal range of motion. She exhibits no edema.  Neurological: She is alert.  Speech is clear and goal oriented Moves extremities without ataxia  Skin: Skin is warm and dry. She is not diaphoretic.  Psychiatric: She has a normal mood and affect.  Nursing note and vitals reviewed.   ED Course  Procedures (including critical care time)  DIAGNOSTIC STUDIES: Oxygen Saturation is 98% on RA, normal by my interpretation.    COORDINATION OF CARE: 11:57 PM-Discussed treatment plan which includes breathing treatment with pt at bedside and pt agreed to plan.   Labs Review Labs Reviewed  CBC - Abnormal; Notable for the following:    WBC 10.7 (*)    Platelets 407 (*)    All other components within normal limits  BASIC METABOLIC PANEL    MDM   Final diagnoses:  Asthma exacerbation    Kristina Mendoza presents with asthma exacerbation. Significant wheezing with inspiratory and expiratory breaths.  Accessory muscle usage. Will give nebulizer and steroids.  1:39 AM Patient ambulated in ED with O2 saturations maintained at 98% (though she became very tachycardic; No chest pain), no current signs of respiratory distress. Lung exam improved after nebulizer treatment. Steroids given in the ED and pt will be dc with 5 day burst. Pt states they are breathing at baseline. Pt has been instructed to continue using prescribed medications and to speak with PCP about today's exacerbation.   I personally performed the services described in this documentation, which was scribed in my presence. The recorded information has been reviewed and is accurate.   Dahlia ClientHannah Ruthel Martine, PA-C 06/25/15 [id number]  Richardean Canalavid H Yao,  MD 06/25/15 2006

## 2015-06-25 LAB — BASIC METABOLIC PANEL
Anion gap: 9 (ref 5–15)
BUN: 15 mg/dL (ref 6–20)
CHLORIDE: 106 mmol/L (ref 101–111)
CO2: 27 mmol/L (ref 22–32)
CREATININE: 0.83 mg/dL (ref 0.44–1.00)
Calcium: 9.3 mg/dL (ref 8.9–10.3)
GFR calc non Af Amer: >60 mL/min (ref >60)
Glucose, Bld: 92 mg/dL (ref 65–99)
Potassium: 3.9 mmol/L (ref 3.5–5.1)
Sodium: 142 mmol/L (ref 135–145)

## 2015-06-25 LAB — CBC
HEMATOCRIT: 42.7 % (ref 36.0–46.0)
Hemoglobin: 14.5 g/dL (ref 12.0–15.0)
MCH: 30.9 pg (ref 26.0–34.0)
MCHC: 34 g/dL (ref 30.0–36.0)
MCV: 90.9 fL (ref 78.0–100.0)
Platelets: 407 10*3/uL — ABNORMAL HIGH (ref 150–400)
RBC: 4.7 MIL/uL (ref 3.87–5.11)
RDW: 12.1 % (ref 11.5–15.5)
WBC: 10.7 10*3/uL — ABNORMAL HIGH (ref 4.0–10.5)

## 2015-06-25 MED ORDER — METHYLPREDNISOLONE SODIUM SUCC 125 MG IJ SOLR
125.0000 mg | Freq: Once | INTRAMUSCULAR | Status: AC
  Administered 2015-06-25: 125 mg via INTRAVENOUS
  Filled 2015-06-25: qty 2

## 2015-06-25 MED ORDER — PREDNISONE 20 MG PO TABS: 40.0000 mg | ORAL_TABLET | Freq: Every day | ORAL | Status: DC

## 2015-06-25 MED ORDER — ALBUTEROL SULFATE HFA 108 (90 BASE) MCG/ACT IN AERS
2.0000 | INHALATION_SPRAY | RESPIRATORY_TRACT | Status: DC | PRN
  Administered 2015-06-25: 2 via RESPIRATORY_TRACT
  Filled 2015-06-25: qty 6.7

## 2015-06-25 MED ORDER — AEROCHAMBER PLUS FLO-VU MEDIUM MISC
1.0000 | Freq: Once | Status: AC
  Administered 2015-06-25: 1
  Filled 2015-06-25 (×2): qty 1

## 2015-06-25 NOTE — Discharge Instructions (Signed)
1. Medications: albuterol, prednisone, usual home medications 2. Treatment: rest, drink plenty of fluids,  3. Follow Up: Please followup with your primary doctor in 1-2 days for discussion of your diagnoses and further evaluation after today's visit; if you do not have a primary care doctor use the resource guide provided to find one; Please return to the ER for difficulty breathing, high fevers or worsening symptoms.

## 2015-11-23 ENCOUNTER — Encounter: Payer: Self-pay | Admitting: Nurse Practitioner

## 2015-11-23 NOTE — Progress Notes (Signed)
Patient ID: Kristina Mendoza, female   DOB: [birth year], 20 y.o.   MRN: [id number]  20 y.o. G0P0000 Single  African American Fe here for annual exam.  Want to get breast reduction.  After weight loss about 1.5 yrs ago did not loose in the cup size.  Has daily neck and shoulder pain, does stretches daily.  Will try to get PT / chiropractic care.  Menses monthly lasting 3 days.  Not dating X 5 months.  Patient's last menstrual period was 11/17/2015.          Sexually active: Not Currently  The current method of family planning is OCP (estrogen/progesterone) and condoms most of the time.    Exercising: Yes.    Cardio, Dance, Muscle Strength Smoker:  no  Health Maintenance: Pap: 11/18/14, Negative TDaP: 08/26/05 Gardasil: completed 06/2011 HIV: 11/18/14 Labs: Blood drawn today.      Urine: Neg   reports that she has never smoked. She has never used smokeless tobacco. She reports that she drinks alcohol. She reports that she does not use drugs.  Past Medical History:  Diagnosis Date  . Asthma   . Eczema   . Seasonal allergies     No past surgical history on file.  Current Outpatient Prescriptions  Medication Sig Dispense Refill  . desogestrel-ethinyl estradiol (VIORELE) 0.15-0.02/0.01 MG (21/5) tablet Take 1 tablet by mouth daily. 84 tablet 3  . Multiple Vitamin (MULTIVITAMIN) tablet Take 1 tablet by mouth daily.     No current facility-administered medications for this visit.     Family History  Problem Relation Age of Onset  . Colon cancer Mother 9442  . Alzheimer's disease Other   . Hypertension Other   . Diabetes Other   . Colon cancer Maternal Grandmother [id number]  . Heart disease Maternal Grandmother   . Dementia Paternal Grandmother   . Deafness Paternal Grandmother     ROS:  Pertinent items are noted in HPI.  Otherwise, a comprehensive ROS was negative.  Exam:   BP 132/90 (BP Location: Right Arm, Patient Position: Sitting, Cuff Size: Normal)   Pulse 80   Resp 16   Ht 5' 1.5"  (1.562 m)   Wt 203 lb 12.8 oz (92.4 kg)   LMP 11/17/2015   BMI 37.88 kg/m  Height: 5' 1.5" (156.2 cm) Ht Readings from Last 3 Encounters:  11/24/15 5' 1.5" (1.562 m)  11/18/14 5\' 2"  (1.575 m)  11/11/13 5' 1.5" (1.562 m)    General appearance: alert, cooperative and appears stated age Head: Normocephalic, without obvious abnormality, atraumatic Neck: no adenopathy, supple, symmetrical, trachea midline and thyroid normal to inspection and palpation Lungs: clear to auscultation bilaterally Breasts: normal appearance, no masses or tenderness Heart: regular rate and rhythm Abdomen: soft, non-tender; no masses,  no organomegaly Extremities: extremities normal, atraumatic, no cyanosis or edema Skin: Skin color, texture, turgor normal. No rashes or lesions Lymph nodes: Cervical, supraclavicular, and axillary nodes normal. No abnormal inguinal nodes palpated Neurologic: Grossly normal   Pelvic: External genitalia:  no lesions              Urethra:  normal appearing urethra with no masses, tenderness or lesions              Bartholin's and Skene's: normal                 Vagina: normal appearing vagina with normal color and discharge, no lesions  Cervix: anteverted              Pap taken: No. Bimanual Exam:  Uterus:  normal size, contour, position, consistency, mobility, non-tender              Adnexa: no mass, fullness, tenderness               Rectovaginal: Confirms               Anus:  normal sphincter tone, no lesions  Chaperone present: yes  A:  Well Woman with normal exam  Contraceptive needs             R/O STD's  Macromastia   P:   Reviewed health and wellness pertinent to exam  Pap smear as above  Refill Viorelle for a year  Follow with STD's  Given information about Macromastia - will try good support bra, PT/ chiropractic care for neck and shoulder, pain diary.  Then after those measures will see plastic surgeon.  counseled on breast self exam, STD  prevention, HIV risk factors and prevention, use and side effects of OCP's, adequate intake of calcium and vitamin D, diet and exercise return annually or prn  An After Visit Summary was printed and given to the patient.

## 2015-11-24 ENCOUNTER — Encounter: Payer: Self-pay | Admitting: Nurse Practitioner

## 2015-11-24 ENCOUNTER — Ambulatory Visit (INDEPENDENT_AMBULATORY_CARE_PROVIDER_SITE_OTHER): Payer: Managed Care, Other (non HMO) | Admitting: Nurse Practitioner

## 2015-11-24 VITALS — BP 132/90 | HR 80 | Resp 16 | Ht 61.5 in | Wt 203.8 lb

## 2015-11-24 DIAGNOSIS — Z23 Encounter for immunization: Secondary | ICD-10-CM | POA: Diagnosis not present

## 2015-11-24 DIAGNOSIS — N62 Hypertrophy of breast: Secondary | ICD-10-CM | POA: Diagnosis not present

## 2015-11-24 DIAGNOSIS — A64 Unspecified sexually transmitted disease: Secondary | ICD-10-CM | POA: Diagnosis not present

## 2015-11-24 DIAGNOSIS — Z0000 Encounter for general adult medical examination without abnormal findings: Secondary | ICD-10-CM | POA: Diagnosis not present

## 2015-11-24 DIAGNOSIS — Z01419 Encounter for gynecological examination (general) (routine) without abnormal findings: Secondary | ICD-10-CM

## 2015-11-24 LAB — POCT URINALYSIS DIPSTICK
Blood, UA: NEGATIVE
GLUCOSE UA: NEGATIVE
KETONES UA: NEGATIVE
LEUKOCYTES UA: NEGATIVE
Nitrite, UA: NEGATIVE
Protein, UA: NEGATIVE
Urobilinogen, UA: NEGATIVE
pH, UA: 5

## 2015-11-24 MED ORDER — DESOGESTREL-ETHINYL ESTRADIOL 0.15-0.02/0.01 MG (21/5) PO TABS: 1.0000 | ORAL_TABLET | Freq: Every day | ORAL | 4 refills | Status: DC

## 2015-11-24 NOTE — Patient Instructions (Signed)
General topics  Next pap or exam is  due in 1 year Take a Women's multivitamin Take 1200 mg. of calcium daily - prefer dietary If any concerns in interim to call back  Breast Self-Awareness Practicing breast self-awareness may pick up problems early, prevent significant medical complications, and possibly save your life. By practicing breast self-awareness, you can become familiar with how your breasts look and feel and if your breasts are changing. This allows you to notice changes early. It can also offer you some reassurance that your breast health is good. One way to learn what is normal for your breasts and whether your breasts are changing is to do a breast self-exam. If you find a lump or something that was not present in the past, it is best to contact your caregiver right away. Other findings that should be evaluated by your caregiver include nipple discharge, especially if it is bloody; skin changes or reddening; areas where the skin seems to be pulled in (retracted); or new lumps and bumps. Breast pain is seldom associated with cancer (malignancy), but should also be evaluated by a caregiver. BREAST SELF-EXAM The best time to examine your breasts is 5 7 days after your menstrual period is over.  ExitCare Patient Information 2013 [employer].   Exercise to Stay Healthy Exercise helps you become and stay healthy. EXERCISE IDEAS AND TIPS Choose exercises that:  You enjoy.  Fit into your day. You do not need to exercise really hard to be healthy. You can do exercises at a slow or medium level and stay healthy. You can:  Stretch before and after working out.  Try yoga, Pilates, or tai chi.  Lift weights.  Walk fast, swim, jog, run, climb stairs, bicycle, dance, or rollerskate.  Take aerobic classes. Exercises that burn about 150 calories:  Running 1  miles in 15 minutes.  Playing volleyball for 45 to 60 minutes.  Washing and waxing a car for 45 to 60  minutes.  Playing touch football for 45 minutes.  Walking 1  miles in 35 minutes.  Pushing a stroller 1  miles in 30 minutes.  Playing basketball for 30 minutes.  Raking leaves for 30 minutes.  Bicycling 5 miles in 30 minutes.  Walking 2 miles in 30 minutes.  Dancing for 30 minutes.  Shoveling snow for 15 minutes.  Swimming laps for 20 minutes.  Walking up stairs for 15 minutes.  Bicycling 4 miles in 15 minutes.  Gardening for 30 to 45 minutes.  Jumping rope for 15 minutes.  Washing windows or floors for 45 to 60 minutes. Document Released: 03/12/2010 Document Revised: 05/02/2011 Document Reviewed: 03/12/2010 ExitCare Patient Information 2013 [employer].   Other topics ( that may be useful information):    Sexually Transmitted Disease Sexually transmitted disease (STD) refers to any infection that is passed from person to person during sexual activity. This may happen by way of saliva, semen, blood, vaginal mucus, or urine. Common STDs include:  Gonorrhea.  Chlamydia.  Syphilis.  HIV/AIDS.  Genital herpes.  Hepatitis B and C.  Trichomonas.  Human papillomavirus (HPV).  Pubic lice. CAUSES  An STD may be spread by bacteria, virus, or parasite. A person can get an STD by:  Sexual intercourse with an infected person.  Sharing sex toys with an infected person.  Sharing needles with an infected person.  Having intimate contact with the genitals, mouth, or rectal areas of an infected person. SYMPTOMS  Some people may not have any symptoms, but   they can still pass the infection to others. Different STDs have different symptoms. Symptoms include:  Painful or bloody urination.  Pain in the pelvis, abdomen, vagina, anus, throat, or eyes.  Skin rash, itching, irritation, growths, or sores (lesions). These usually occur in the genital or anal area.  Abnormal vaginal discharge.  Penile discharge in men.  Soft, flesh-colored skin growths in the  genital or anal area.  Fever.  Pain or bleeding during sexual intercourse.  Swollen glands in the groin area.  Yellow skin and eyes (jaundice). This is seen with hepatitis. DIAGNOSIS  To make a diagnosis, your caregiver may:  Take a medical history.  Perform a physical exam.  Take a specimen (culture) to be examined.  Examine a sample of discharge under a microscope.  Perform blood test TREATMENT   Chlamydia, gonorrhea, trichomonas, and syphilis can be cured with antibiotic medicine.  Genital herpes, hepatitis, and HIV can be treated, but not cured, with prescribed medicines. The medicines will lessen the symptoms.  Genital warts from HPV can be treated with medicine or by freezing, burning (electrocautery), or surgery. Warts may come back.  HPV is a virus and cannot be cured with medicine or surgery.However, abnormal areas may be followed very closely by your caregiver and may be removed from the cervix, vagina, or vulva through office procedures or surgery. If your diagnosis is confirmed, your recent sexual partners need treatment. This is true even if they are symptom-free or have a negative culture or evaluation. They should not have sex until their caregiver says it is okay. HOME CARE INSTRUCTIONS  All sexual partners should be informed, tested, and treated for all STDs.  Take your antibiotics as directed. Finish them even if you start to feel better.  Only take over-the-counter or prescription medicines for pain, discomfort, or fever as directed by your caregiver.  Rest.  Eat a balanced diet and drink enough fluids to keep your urine clear or pale yellow.  Do not have sex until treatment is completed and you have followed up with your caregiver. STDs should be checked after treatment.  Keep all follow-up appointments, Pap tests, and blood tests as directed by your caregiver.  Only use latex condoms and water-soluble lubricants during sexual activity. Do not use  petroleum jelly or oils.  Avoid alcohol and illegal drugs.  Get vaccinated for HPV and hepatitis. If you have not received these vaccines in the past, talk to your caregiver about whether one or both might be right for you.  Avoid risky sex practices that can break the skin. The only way to avoid getting an STD is to avoid all sexual activity.Latex condoms and dental dams (for oral sex) will help lessen the risk of getting an STD, but will not completely eliminate the risk. SEEK MEDICAL CARE IF:   You have a fever.  You have any new or worsening symptoms. Document Released: 04/30/2002 Document Revised: 05/02/2011 Document Reviewed: 05/07/2010 [facility] Patient Information 2013 [employer].    Domestic Abuse You are being battered or abused if someone close to you hits, pushes, or physically hurts you in any way. You also are being abused if you are forced into activities. You are being sexually abused if you are forced to have sexual contact of any kind. You are being emotionally abused if you are made to feel worthless or if you are constantly threatened. It is important to remember that help is available. No one has the right to abuse you. PREVENTION OF FURTHER  ABUSE  Learn the warning signs of danger. This varies with situations but may include: the use of alcohol, threats, isolation from friends and family, or forced sexual contact. Leave if you feel that violence is going to occur.  If you are attacked or beaten, report it to the police so the abuse is documented. You do not have to press charges. The police can protect you while you or the attackers are leaving. Get the officer's name and badge number and a copy of the report.  Find someone you can trust and tell them what is happening to you: your caregiver, a nurse, clergy member, close friend or family member. Feeling ashamed is natural, but remember that you have done nothing wrong. No one deserves abuse. Document Released:  02/05/2000 Document Revised: 05/02/2011 Document Reviewed: 04/15/2010 ExitCare Patient Information 2013 [employer].    How Much is Too Much Alcohol? Drinking too much alcohol can cause injury, accidents, and health problems. These types of problems can include:   Car crashes.  Falls.  Family fighting (domestic violence).  Drowning.  Fights.  Injuries.  Burns.  Damage to certain organs.  Having a baby with birth defects. ONE DRINK CAN BE TOO MUCH WHEN YOU ARE:  Working.  Pregnant or breastfeeding.  Taking medicines. Ask your doctor.  Driving or planning to drive. If you or someone you know has a drinking problem, get help from a doctor.  Document Released: 12/04/2008 Document Revised: 05/02/2011 Document Reviewed: 12/04/2008 ExitCare Patient Information 2013 [employer].   Smoking Hazards Smoking cigarettes is extremely bad for your health. Tobacco smoke has over 200 known poisons in it. There are over 60 chemicals in tobacco smoke that cause cancer. Some of the chemicals found in cigarette smoke include:   Cyanide.  Benzene.  Formaldehyde.  Methanol (wood alcohol).  Acetylene (fuel used in welding torches).  Ammonia. Cigarette smoke also contains the poisonous gases nitrogen oxide and carbon monoxide.  Cigarette smokers have an increased risk of many serious medical problems and Smoking causes approximately:  90% of all lung cancer deaths in men.  80% of all lung cancer deaths in women.  90% of deaths from chronic obstructive lung disease. Compared with nonsmokers, smoking increases the risk of:  Coronary heart disease by 2 to 4 times.  Stroke by 2 to 4 times.  Men developing lung cancer by 23 times.  Women developing lung cancer by 13 times.  Dying from chronic obstructive lung diseases by 12 times.  . Smoking is the most preventable cause of death and disease in our society.  WHY IS SMOKING ADDICTIVE?  Nicotine is the chemical  agent in tobacco that is capable of causing addiction or dependence.  When you smoke and inhale, nicotine is absorbed rapidly into the bloodstream through your lungs. Nicotine absorbed through the lungs is capable of creating a powerful addiction. Both inhaled and non-inhaled nicotine may be addictive.  Addiction studies of cigarettes and spit tobacco show that addiction to nicotine occurs mainly during the teen years, when young people begin using tobacco products. WHAT ARE THE BENEFITS OF QUITTING?  There are many health benefits to quitting smoking.   Likelihood of developing cancer and heart disease decreases. Health improvements are seen almost immediately.  Blood pressure, pulse rate, and breathing patterns start returning to normal soon after quitting. QUITTING SMOKING   American Lung Association - 1-800-LUNGUSA  American Cancer Society - 1-800-ACS-2345 Document Released: 03/17/2004 Document Revised: 05/02/2011 Document Reviewed: 11/19/2008 ExitCare Patient Information 2013 ExitCare,   LLC.   Stress Management Stress is a state of physical or mental tension that often results from changes in your life or normal routine. Some common causes of stress are:  Death of a loved one.  Injuries or severe illnesses.  Getting fired or changing jobs.  Moving into a new home. Other causes may be:  Sexual problems.  Business or financial losses.  Taking on a large debt.  Regular conflict with someone at home or at work.  Constant tiredness from lack of sleep. It is not just bad things that are stressful. It may be stressful to:  Win the lottery.  Get married.  Buy a new car. The amount of stress that can be easily tolerated varies from person to person. Changes generally cause stress, regardless of the types of change. Too much stress can affect your health. It may lead to physical or emotional problems. Too little stress (boredom) may also become stressful. SUGGESTIONS TO  REDUCE STRESS:  Talk things over with your family and friends. It often is helpful to share your concerns and worries. If you feel your problem is serious, you may want to get help from a professional counselor.  Consider your problems one at a time instead of lumping them all together. Trying to take care of everything at once may seem impossible. List all the things you need to do and then start with the most important one. Set a goal to accomplish 2 or 3 things each day. If you expect to do too many in a single day you will naturally fail, causing you to feel even more stressed.  Do not use alcohol or drugs to relieve stress. Although you may feel better for a short time, they do not remove the problems that caused the stress. They can also be habit forming.  Exercise regularly - at least 3 times per week. Physical exercise can help to relieve that "uptight" feeling and will relax you.  The shortest distance between despair and hope is often a good night's sleep.  Go to bed and get up on time allowing yourself time for appointments without being rushed.  Take a short "time-out" period from any stressful situation that occurs during the day. Close your eyes and take some deep breaths. Starting with the muscles in your face, tense them, hold it for a few seconds, then relax. Repeat this with the muscles in your neck, shoulders, hand, stomach, back and legs.  Take good care of yourself. Eat a balanced diet and get plenty of rest.  Schedule time for having fun. Take a break from your daily routine to relax. HOME CARE INSTRUCTIONS   Call if you feel overwhelmed by your problems and feel you can no longer manage them on your own.  Return immediately if you feel like hurting yourself or someone else. Document Released: 08/03/2000 Document Revised: 05/02/2011 Document Reviewed: 03/26/2007 ExitCare Patient Information 2013 ExitCare, [employer].  

## 2015-11-25 LAB — STD PANEL
HIV 1&2 Ab, 4th Generation: NONREACTIVE
Hepatitis B Surface Ag: NEGATIVE

## 2015-11-26 LAB — IPS N GONORRHOEA AND CHLAMYDIA BY PCR

## 2015-11-27 NOTE — Progress Notes (Signed)
Encounter reviewed by Dr. Brook Amundson C. Silva.  

## 2015-12-18 ENCOUNTER — Other Ambulatory Visit: Payer: Self-pay | Admitting: Nurse Practitioner

## 2016-11-24 ENCOUNTER — Ambulatory Visit (INDEPENDENT_AMBULATORY_CARE_PROVIDER_SITE_OTHER): Payer: 59 | Admitting: Certified Nurse Midwife

## 2016-11-24 ENCOUNTER — Encounter: Payer: Self-pay | Admitting: Certified Nurse Midwife

## 2016-11-24 VITALS — BP 116/78 | HR 68 | Resp 16 | Ht 60.75 in | Wt 200.0 lb

## 2016-11-24 DIAGNOSIS — Z3041 Encounter for surveillance of contraceptive pills: Secondary | ICD-10-CM

## 2016-11-24 DIAGNOSIS — Z01419 Encounter for gynecological examination (general) (routine) without abnormal findings: Secondary | ICD-10-CM

## 2016-11-24 DIAGNOSIS — Z800 Family history of malignant neoplasm of digestive organs: Secondary | ICD-10-CM | POA: Diagnosis not present

## 2016-11-24 MED ORDER — DESOGESTREL-ETHINYL ESTRADIOL 0.15-0.02/0.01 MG (21/5) PO TABS: 1.0000 | ORAL_TABLET | Freq: Every day | ORAL | 4 refills | Status: DC

## 2016-11-24 NOTE — Patient Instructions (Signed)
General topics  Next pap or exam is  due in 1 year Take a Women's multivitamin Take 1200 mg. of calcium daily - prefer dietary If any concerns in interim to call back  Breast Self-Awareness Practicing breast self-awareness may pick up problems early, prevent significant medical complications, and possibly save your life. By practicing breast self-awareness, you can become familiar with how your breasts look and feel and if your breasts are changing. This allows you to notice changes early. It can also offer you some reassurance that your breast health is good. One way to learn what is normal for your breasts and whether your breasts are changing is to do a breast self-exam. If you find a lump or something that was not present in the past, it is best to contact your caregiver right away. Other findings that should be evaluated by your caregiver include nipple discharge, especially if it is bloody; skin changes or reddening; areas where the skin seems to be pulled in (retracted); or new lumps and bumps. Breast pain is seldom associated with cancer (malignancy), but should also be evaluated by a caregiver. BREAST SELF-EXAM The best time to examine your breasts is 5 7 days after your menstrual period is over.  ExitCare Patient Information 2013 ExitCare[employer].   Exercise to Stay Healthy Exercise helps you become and stay healthy. EXERCISE IDEAS AND TIPS Choose exercises that:  You enjoy.  Fit into your day. You do not need to exercise really hard to be healthy. You can do exercises at a slow or medium level and stay healthy. You can:  Stretch before and after working out.  Try yoga, Pilates, or tai chi.  Lift weights.  Walk fast, swim, jog, run, climb stairs, bicycle, dance, or rollerskate.  Take aerobic classes. Exercises that burn about 150 calories:  Running 1  miles in 15 minutes.  Playing volleyball for 45 to 60 minutes.  Washing and waxing a car for 45 to 60  minutes.  Playing touch football for 45 minutes.  Walking 1  miles in 35 minutes.  Pushing a stroller 1  miles in 30 minutes.  Playing basketball for 30 minutes.  Raking leaves for 30 minutes.  Bicycling 5 miles in 30 minutes.  Walking 2 miles in 30 minutes.  Dancing for 30 minutes.  Shoveling snow for 15 minutes.  Swimming laps for 20 minutes.  Walking up stairs for 15 minutes.  Bicycling 4 miles in 15 minutes.  Gardening for 30 to 45 minutes.  Jumping rope for 15 minutes.  Washing windows or floors for 45 to 60 minutes. Document Released: 03/12/2010 Document Revised: 05/02/2011 Document Reviewed: 03/12/2010 ExitCare Patient Information 2013 ExitCare[employer].   Other topics ( that may be useful information):    Sexually Transmitted Disease Sexually transmitted disease (STD) refers to any infection that is passed from person to person during sexual activity. This may happen by way of saliva, semen, blood, vaginal mucus, or urine. Common STDs include:  Gonorrhea.  Chlamydia.  Syphilis.  HIV/AIDS.  Genital herpes.  Hepatitis B and C.  Trichomonas.  Human papillomavirus (HPV).  Pubic lice. CAUSES  An STD may be spread by bacteria, virus, or parasite. A person can get an STD by:  Sexual intercourse with an infected person.  Sharing sex toys with an infected person.  Sharing needles with an infected person.  Having intimate contact with the genitals, mouth, or rectal areas of an infected person. SYMPTOMS  Some people may not have any symptoms, but   they can still pass the infection to others. Different STDs have different symptoms. Symptoms include:  Painful or bloody urination.  Pain in the pelvis, abdomen, vagina, anus, throat, or eyes.  Skin rash, itching, irritation, growths, or sores (lesions). These usually occur in the genital or anal area.  Abnormal vaginal discharge.  Penile discharge in men.  Soft, flesh-colored skin growths in the  genital or anal area.  Fever.  Pain or bleeding during sexual intercourse.  Swollen glands in the groin area.  Yellow skin and eyes (jaundice). This is seen with hepatitis. DIAGNOSIS  To make a diagnosis, your caregiver may:  Take a medical history.  Perform a physical exam.  Take a specimen (culture) to be examined.  Examine a sample of discharge under a microscope.  Perform blood test TREATMENT   Chlamydia, gonorrhea, trichomonas, and syphilis can be cured with antibiotic medicine.  Genital herpes, hepatitis, and HIV can be treated, but not cured, with prescribed medicines. The medicines will lessen the symptoms.  Genital warts from HPV can be treated with medicine or by freezing, burning (electrocautery), or surgery. Warts may come back.  HPV is a virus and cannot be cured with medicine or surgery.However, abnormal areas may be followed very closely by your caregiver and may be removed from the cervix, vagina, or vulva through office procedures or surgery. If your diagnosis is confirmed, your recent sexual partners need treatment. This is true even if they are symptom-free or have a negative culture or evaluation. They should not have sex until their caregiver says it is okay. HOME CARE INSTRUCTIONS  All sexual partners should be informed, tested, and treated for all STDs.  Take your antibiotics as directed. Finish them even if you start to feel better.  Only take over-the-counter or prescription medicines for pain, discomfort, or fever as directed by your caregiver.  Rest.  Eat a balanced diet and drink enough fluids to keep your urine clear or pale yellow.  Do not have sex until treatment is completed and you have followed up with your caregiver. STDs should be checked after treatment.  Keep all follow-up appointments, Pap tests, and blood tests as directed by your caregiver.  Only use latex condoms and water-soluble lubricants during sexual activity. Do not use  petroleum jelly or oils.  Avoid alcohol and illegal drugs.  Get vaccinated for HPV and hepatitis. If you have not received these vaccines in the past, talk to your caregiver about whether one or both might be right for you.  Avoid risky sex practices that can break the skin. The only way to avoid getting an STD is to avoid all sexual activity.Latex condoms and dental dams (for oral sex) will help lessen the risk of getting an STD, but will not completely eliminate the risk. SEEK MEDICAL CARE IF:   You have a fever.  You have any new or worsening symptoms. Document Released: 04/30/2002 Document Revised: 05/02/2011 Document Reviewed: 05/07/2010 [facility] Patient Information 2013 [employer].    Domestic Abuse You are being battered or abused if someone close to you hits, pushes, or physically hurts you in any way. You also are being abused if you are forced into activities. You are being sexually abused if you are forced to have sexual contact of any kind. You are being emotionally abused if you are made to feel worthless or if you are constantly threatened. It is important to remember that help is available. No one has the right to abuse you. PREVENTION OF FURTHER  ABUSE  Learn the warning signs of danger. This varies with situations but may include: the use of alcohol, threats, isolation from friends and family, or forced sexual contact. Leave if you feel that violence is going to occur.  If you are attacked or beaten, report it to the police so the abuse is documented. You do not have to press charges. The police can protect you while you or the attackers are leaving. Get the officer's name and badge number and a copy of the report.  Find someone you can trust and tell them what is happening to you: your caregiver, a nurse, clergy member, close friend or family member. Feeling ashamed is natural, but remember that you have done nothing wrong. No one deserves abuse. Document Released:  02/05/2000 Document Revised: 05/02/2011 Document Reviewed: 04/15/2010 ExitCare Patient Information 2013 [employer]Care[employer].    How Much is Too Much Alcohol? Drinking too much alcohol can cause injury, accidents, and health problems. These types of problems can include:   Car crashes.  Falls.  Family fighting (domestic violence).  Drowning.  Fights.  Injuries.  Burns.  Damage to certain organs.  Having a baby with birth defects. ONE DRINK CAN BE TOO MUCH WHEN YOU ARE:  Working.  Pregnant or breastfeeding.  Taking medicines. Ask your doctor.  Driving or planning to drive. If you or someone you know has a drinking problem, get help from a doctor.  Document Released: 12/04/2008 Document Revised: 05/02/2011 Document Reviewed: 12/04/2008 ExitCare Patient Information 2013 ExitCare[employer].   Smoking Hazards Smoking cigarettes is extremely bad for your health. Tobacco smoke has over 200 known poisons in it. There are over 60 chemicals in tobacco smoke that cause cancer. Some of the chemicals found in cigarette smoke include:   Cyanide.  Benzene.  Formaldehyde.  Methanol (wood alcohol).  Acetylene (fuel used in welding torches).  Ammonia. Cigarette smoke also contains the poisonous gases nitrogen oxide and carbon monoxide.  Cigarette smokers have an increased risk of many serious medical problems and Smoking causes approximately:  90% of all lung cancer deaths in men.  80% of all lung cancer deaths in women.  90% of deaths from chronic obstructive lung disease. Compared with nonsmokers, smoking increases the risk of:  Coronary heart disease by 2 to 4 times.  Stroke by 2 to 4 times.  Men developing lung cancer by 23 times.  Women developing lung cancer by 13 times.  Dying from chronic obstructive lung diseases by 12 times.  . Smoking is the most preventable cause of death and disease in our society.  WHY IS SMOKING ADDICTIVE?  Nicotine is the chemical  agent in tobacco that is capable of causing addiction or dependence.  When you smoke and inhale, nicotine is absorbed rapidly into the bloodstream through your lungs. Nicotine absorbed through the lungs is capable of creating a powerful addiction. Both inhaled and non-inhaled nicotine may be addictive.  Addiction studies of cigarettes and spit tobacco show that addiction to nicotine occurs mainly during the teen years, when young people begin using tobacco products. WHAT ARE THE BENEFITS OF QUITTING?  There are many health benefits to quitting smoking.   Likelihood of developing cancer and heart disease decreases. Health improvements are seen almost immediately.  Blood pressure, pulse rate, and breathing patterns start returning to normal soon after quitting. QUITTING SMOKING   American Lung Association - 1-800-LUNGUSA  American Cancer Society - 1-800-ACS-2345 Document Released: 03/17/2004 Document Revised: 05/02/2011 Document Reviewed: 11/19/2008 ExitCare Patient Information 2013 ExitCare,   LLC.   Stress Management Stress is a state of physical or mental tension that often results from changes in your life or normal routine. Some common causes of stress are:  Death of a loved one.  Injuries or severe illnesses.  Getting fired or changing jobs.  Moving into a new home. Other causes may be:  Sexual problems.  Business or financial losses.  Taking on a large debt.  Regular conflict with someone at home or at work.  Constant tiredness from lack of sleep. It is not just bad things that are stressful. It may be stressful to:  Win the lottery.  Get married.  Buy a new car. The amount of stress that can be easily tolerated varies from person to person. Changes generally cause stress, regardless of the types of change. Too much stress can affect your health. It may lead to physical or emotional problems. Too little stress (boredom) may also become stressful. SUGGESTIONS TO  REDUCE STRESS:  Talk things over with your family and friends. It often is helpful to share your concerns and worries. If you feel your problem is serious, you may want to get help from a professional counselor.  Consider your problems one at a time instead of lumping them all together. Trying to take care of everything at once may seem impossible. List all the things you need to do and then start with the most important one. Set a goal to accomplish 2 or 3 things each day. If you expect to do too many in a single day you will naturally fail, causing you to feel even more stressed.  Do not use alcohol or drugs to relieve stress. Although you may feel better for a short time, they do not remove the problems that caused the stress. They can also be habit forming.  Exercise regularly - at least 3 times per week. Physical exercise can help to relieve that "uptight" feeling and will relax you.  The shortest distance between despair and hope is often a good night's sleep.  Go to bed and get up on time allowing yourself time for appointments without being rushed.  Take a short "time-out" period from any stressful situation that occurs during the day. Close your eyes and take some deep breaths. Starting with the muscles in your face, tense them, hold it for a few seconds, then relax. Repeat this with the muscles in your neck, shoulders, hand, stomach, back and legs.  Take good care of yourself. Eat a balanced diet and get plenty of rest.  Schedule time for having fun. Take a break from your daily routine to relax. HOME CARE INSTRUCTIONS   Call if you feel overwhelmed by your problems and feel you can no longer manage them on your own.  Return immediately if you feel like hurting yourself or someone else. Document Released: 08/03/2000 Document Revised: 05/02/2011 Document Reviewed: 03/26/2007 ExitCare Patient Information 2013 ExitCare, LLC.   

## 2016-11-24 NOTE — Progress Notes (Signed)
21 y.o. G0P0000 Single  African American Fe here for annual exam. Periods normal, had a change with new generic OCP. Stopped pills for one month and period normal, so restarted. Not sexually active currently. No STD screening needed. Attending college in [place] in [place]. Had ER visit for asthma, on Singulare  Now. Has been working on weight control. No other health issues now. See Dermatology for Eczema.   Patient's last menstrual period was 11/15/2016 (exact date).          Sexually active: No.  The current method of family planning is OCP (estrogen/progesterone).    Exercising: Yes.    cardio, strengthening Smoker:  no  Health Maintenance: Pap:  11-18-14 neg History of Abnormal Pap: no MMG:  none Self Breast exams: yes Colonoscopy:  none BMD:   none TDaP:  2018 Shingles: no Pneumonia: no Hep C and HIV: HIV neg 2017 Labs: none   reports that she has never smoked. She has never used smokeless tobacco. She reports that she drinks alcohol. She reports that she does not use drugs.  Past Medical History:  Diagnosis Date  . Asthma   . Eczema   . Seasonal allergies     History reviewed. No pertinent surgical history.  Current Outpatient Prescriptions  Medication Sig Dispense Refill  . desogestrel-ethinyl estradiol (VIORELE) 0.15-0.02/0.01 MG (21/5) tablet Take 1 tablet by mouth daily. 84 tablet 4  . montelukast (SINGULAIR) 10 MG tablet Take 10 mg by mouth at bedtime.    . Multiple Vitamin (MULTIVITAMIN) tablet Take 1 tablet by mouth daily.    Marland Kitchen triamcinolone cream (KENALOG) 0.5 % APPLY 1 APPLICATION TO AFFECTED AREA ONCE A DAY AS NEEDED EXTERNALLY  1   No current facility-administered medications for this visit.     Family History  Problem Relation Age of Onset  . Colon cancer Mother 4       stage II/III  . Alzheimer's disease Other   . Hypertension Other   . Diabetes Other   . Colon cancer Maternal Grandmother 63  . Heart disease Maternal Grandmother   .  Dementia Paternal Grandmother   . Deafness Paternal Grandmother   . Colon cancer Maternal Uncle 60       stage II    ROS:  Pertinent items are noted in HPI.  Otherwise, a comprehensive ROS was negative.  Exam:   BP 116/78   Pulse 68   Resp 16   Ht 5' 0.75" (1.543 m)   Wt 200 lb (90.7 kg)   LMP 11/15/2016 (Exact Date)   BMI 38.10 kg/m  Height: 5' 0.75" (154.3 cm) Ht Readings from Last 3 Encounters:  11/24/16 5' 0.75" (1.543 m)  11/24/15 5' 1.5" (1.562 m)  11/18/14  (1.575 m)    General appearance: alert, cooperative and appears stated age Head: Normocephalic, without obvious abnormality, atraumatic Neck: no adenopathy, supple, symmetrical, trachea midline and thyroid normal to inspection and palpation Lungs: clear to auscultation bilaterally Breasts: normal appearance, no masses or tenderness, No nipple retraction or dimpling, No nipple discharge or bleeding, No axillary or supraclavicular adenopathy Heart: regular rate and rhythm Abdomen: soft, non-tender; no masses,  no organomegaly Extremities: extremities normal, atraumatic, no cyanosis or edema Skin: Skin color, texture, turgor normal. No rashes or lesions Lymph nodes: Cervical, supraclavicular, and axillary nodes normal. No abnormal inguinal nodes palpated Neurologic: Grossly normal   Pelvic: External genitalia:  no lesions              Urethra:  normal appearing urethra with no masses, tenderness or lesions              Bartholin's and Skene's: normal                 Vagina: normal appearing vagina with normal color and discharge, no lesions              Cervix: no cervical motion tenderness, no lesions and nulliparous appearance              Pap taken: No. Bimanual Exam:  Uterus:  normal size, contour, position, consistency, mobility, non-tender              Adnexa: normal adnexa and no mass, fullness, tenderness               Rectovaginal: Confirms               Anus:  normal   Chaperone present: yes  A:   Well Woman with normal exam  Contraception OCP desired  Eczema under MD management, good control now  Family history of colon cancer mother age 81  P:   Reviewed health and wellness pertinent to exam  Rx Viorele see order with instructions, patient will request same generic each time if has problems with generic use again or will advise  Discussed importance of colonoscopy screening at age 45 due to family history. Patient aware.  Pap smear: no   counseled on breast self exam, STD prevention, HIV risk factors and prevention, use and side effects of OCP's, adequate intake of calcium and vitamin D, diet and exercise  return annually or prn  An After Visit Summary was printed and given to the patient.

## 2016-11-25 ENCOUNTER — Encounter: Payer: Self-pay | Admitting: Certified Nurse Midwife

## 2016-11-30 ENCOUNTER — Ambulatory Visit: Payer: Managed Care, Other (non HMO) | Admitting: Nurse Practitioner

## 2017-11-30 ENCOUNTER — Ambulatory Visit: Payer: 59 | Admitting: Certified Nurse Midwife

## 2018-01-11 ENCOUNTER — Other Ambulatory Visit: Payer: Self-pay | Admitting: Certified Nurse Midwife

## 2018-01-11 DIAGNOSIS — Z3041 Encounter for surveillance of contraceptive pills: Secondary | ICD-10-CM

## 2018-01-15 ENCOUNTER — Other Ambulatory Visit: Payer: Self-pay | Admitting: Certified Nurse Midwife

## 2018-01-15 DIAGNOSIS — Z3041 Encounter for surveillance of contraceptive pills: Secondary | ICD-10-CM

## 2018-01-15 MED ORDER — DESOGESTREL-ETHINYL ESTRADIOL 0.15-0.02/0.01 MG (21/5) PO TABS: 1.0000 | ORAL_TABLET | Freq: Every day | ORAL | 0 refills | Status: DC

## 2018-01-15 NOTE — Telephone Encounter (Signed)
Patient is going to college out of state at [employer] in [place] and has scheduled a wellness exam for 03/08/17 there. She is requesting refills on Kariva to last until then, if possible.  [facility] [phone]

## 2018-01-15 NOTE — Telephone Encounter (Signed)
Medication refill request: OCP Last AEX:  11-24-16 DL  Next AEX: patient has an appointment scheduled for 03-08-18 in [place]  Last MMG (if hormonal medication request): n/a Refill authorized: please advise.

## 2018-08-02 ENCOUNTER — Other Ambulatory Visit: Payer: Self-pay

## 2018-08-02 ENCOUNTER — Ambulatory Visit: Payer: 59 | Admitting: Certified Nurse Midwife

## 2018-08-06 ENCOUNTER — Other Ambulatory Visit: Payer: Self-pay

## 2018-08-06 ENCOUNTER — Encounter: Payer: Self-pay | Admitting: Certified Nurse Midwife

## 2018-08-06 ENCOUNTER — Other Ambulatory Visit (HOSPITAL_COMMUNITY)
Admission: RE | Admit: 2018-08-06 | Discharge: 2018-08-06 | Disposition: A | Discharge status: Home / Self Care | Payer: 59 | Source: Ambulatory Visit | Attending: Certified Nurse Midwife | Admitting: Certified Nurse Midwife

## 2018-08-06 ENCOUNTER — Ambulatory Visit (INDEPENDENT_AMBULATORY_CARE_PROVIDER_SITE_OTHER): Payer: 59 | Admitting: Certified Nurse Midwife

## 2018-08-06 VITALS — BP 124/82 | HR 70 | Temp 97.6°F | Resp 16 | Ht 61.25 in | Wt 188.0 lb

## 2018-08-06 DIAGNOSIS — Z01419 Encounter for gynecological examination (general) (routine) without abnormal findings: Secondary | ICD-10-CM | POA: Diagnosis not present

## 2018-08-06 DIAGNOSIS — Z113 Encounter for screening for infections with a predominantly sexual mode of transmission: Secondary | ICD-10-CM

## 2018-08-06 DIAGNOSIS — Z124 Encounter for screening for malignant neoplasm of cervix: Secondary | ICD-10-CM | POA: Diagnosis not present

## 2018-08-06 NOTE — Progress Notes (Signed)
23 y.o. G0P0000 Single  African American Fe here for annual exam. Periods normal no issues. Contraception OCP working well. Student  Health refilled pills. Denies headaches or warning signs with OCP use.  Have been sexually active and request STD screening. Recent graduation from college! No  Health issues today.  Patient's last menstrual period was 08/01/2018 (exact date).          Sexually active: Yes.    The current method of family planning is OCP (estrogen/progesterone).    Exercising: Yes.    kickboxing Smoker:  no  Review of Systems  Constitutional: Negative.   HENT: Negative.   Eyes: Negative.   Respiratory: Negative.   Cardiovascular: Negative.   Gastrointestinal: Negative.   Genitourinary: Negative.   Musculoskeletal: Negative.   Skin: Negative.   Neurological: Negative.   Endo/Heme/Allergies: Negative.   Psychiatric/Behavioral: Negative.     Health Maintenance: Pap:  11-18-14 neg History of Abnormal Pap: no MMG:  none Self Breast exams: yes Colonoscopy:  none BMD:   none TDaP:  2018 Shingles: no Pneumonia: no Hep C and HIV: HIV neg 2017 Labs:    reports that she has never smoked. She has never used smokeless tobacco. She reports previous alcohol use. She reports that she does not use drugs.  Past Medical History:  Diagnosis Date  . Asthma   . Eczema   . Seasonal allergies     History reviewed. No pertinent surgical history.  Current Outpatient Medications  Medication Sig Dispense Refill  . albuterol (PROVENTIL) (2.5 MG/3ML) 0.083% nebulizer solution USE 3 ML THREE TIMES A DAY AS NEEDED FOR ASTHMA EXACERBATION INHALATION    . albuterol (VENTOLIN HFA) 108 (90 Base) MCG/ACT inhaler TAKE 2 PUFFS BY MOUTH EVERY 4 HOURS AS NEEDED    . cyclobenzaprine (FLEXERIL) 10 MG tablet Take by mouth.    . desogestrel-ethinyl estradiol (VIORELE) 0.15-0.02/0.01 MG (21/5) tablet Take 1 tablet by mouth daily. 84 tablet 0  . Multiple Vitamin (MULTIVITAMIN) tablet Take 1 tablet  by mouth daily.    Marland Kitchen triamcinolone cream (KENALOG) 0.5 % APPLY 1 APPLICATION TO AFFECTED AREA ONCE A DAY AS NEEDED EXTERNALLY  1   No current facility-administered medications for this visit.     Family History  Problem Relation Age of Onset  . Colon cancer Mother 43       stage II/III  . Alzheimer's disease Other   . Hypertension Other   . Diabetes Other   . Colon cancer Maternal Grandmother 2  . Heart disease Maternal Grandmother   . Dementia Paternal Grandmother   . Deafness Paternal Grandmother   . Colon cancer Maternal Uncle 60       stage II great uncle  . Other Maternal Uncle        heart problems under age 67    ROS:  Pertinent items are noted in HPI.  Otherwise, a comprehensive ROS was negative.  Exam:   BP 136/90   Pulse 70   Temp 97.6 F (36.4 C) (Skin)   Resp 16   Ht 5' 1.25" (1.556 m)   Wt 188 lb (85.3 kg)   LMP 08/01/2018 (Exact Date)   BMI 35.23 kg/m  Height: 5' 1.25" (155.6 cm) Ht Readings from Last 3 Encounters:  08/06/18 5' 1.25" (1.556 m)  11/24/16 5' 0.75" (1.543 m)  11/24/15 5' 1.5" (1.562 m)    General appearance: alert, cooperative and appears stated age Head: Normocephalic, without obvious abnormality, atraumatic Neck: no adenopathy, supple, symmetrical, trachea midline and  thyroid normal to inspection and palpation Lungs: clear to auscultation bilaterally Breasts: normal appearance, no masses or tenderness, No nipple retraction or dimpling, No nipple discharge or bleeding, No axillary or supraclavicular adenopathy Heart: regular rate and rhythm Abdomen: soft, non-tender; no masses,  no organomegaly Extremities: extremities normal, atraumatic, no cyanosis or edema Skin: Skin color, texture, turgor normal. No rashes or lesions Lymph nodes: Cervical, supraclavicular, and axillary nodes normal. No abnormal inguinal nodes palpated Neurologic: Grossly normal   Pelvic: External genitalia:  no lesions              Urethra:  normal appearing  urethra with no masses, tenderness or lesions              Bartholin's and Skene's: normal                 Vagina: normal appearing vagina with normal color and discharge, no lesions              Cervix: no cervical motion tenderness, no lesions and nulliparous appearance              Pap taken: Yes.   Bimanual Exam:  Uterus:  normal size, contour, position, consistency, mobility, non-tender and anteverted              Adnexa: normal adnexa and no mass, fullness, tenderness               Rectovaginal: Confirms               Anus:  normal appearance  Chaperone present: yes  A:  Well Woman with normal exam  Contraception OCP ( dispensed at college)  STD screening with pap only  Overweight working on diet( just graduated!)    P:   Reviewed health and wellness pertinent to exam  Risks/benefits/warning signs of OCP given and advise if occurring.  Discussed working on portion control and food choices with regular exercises.  Pap smear: yes   counseled on breast self exam, STD prevention, HIV risk factors and prevention, use and side effects of OCP's, adequate intake of calcium and vitamin D, diet and exercise  return annually or prn  An After Visit Summary was printed and given to the patient.

## 2018-08-08 LAB — CYTOLOGY - PAP
Chlamydia: NEGATIVE
Diagnosis: NEGATIVE
Neisseria Gonorrhea: NEGATIVE
Trichomonas: NEGATIVE

## 2018-12-13 ENCOUNTER — Telehealth: Payer: Self-pay | Admitting: Certified Nurse Midwife

## 2018-12-13 NOTE — Telephone Encounter (Signed)
Patient is looking into having breast reduction surgery. Has already been seen by a plastic surgeon, but needs a second opinion from another provider and would like to know if this is something we can assist her with.

## 2018-12-14 NOTE — Telephone Encounter (Signed)
Fine to schedule consult.

## 2018-12-14 NOTE — Telephone Encounter (Signed)
Spoke with patient. Patient has seen plastic surgeon/Dr. Towanda Malkin for breast reduction consult. Patient states she will need a second opinion for her insurance.   Has discussed with Kristina Boroughs, NP in the past, see 11/24/15 AEX. Patient states her back pain is managed by her PCP. Has been getting massages, weight loss of 35-40 lbs and wearing a supportive bra.   Discussed patient f/u with PCP for second opinion since they are managing her back pain. Patient asking if she could schedule a consult? Advised I will review with Kristina Mendoza, CNM and return call. Patient agreeable.   Kristina Mendoza, CNM -please review.

## 2018-12-17 NOTE — Telephone Encounter (Signed)
Spoke with patient. OV scheduled for 10/30 at 9am with Melvia Heaps, CNM.   Routing to provider for final review. Patient is agreeable to disposition. Will close encounter.

## 2018-12-19 ENCOUNTER — Other Ambulatory Visit: Payer: Self-pay

## 2018-12-21 ENCOUNTER — Encounter: Payer: Self-pay | Admitting: Certified Nurse Midwife

## 2018-12-21 ENCOUNTER — Other Ambulatory Visit: Payer: Self-pay

## 2018-12-21 ENCOUNTER — Ambulatory Visit (INDEPENDENT_AMBULATORY_CARE_PROVIDER_SITE_OTHER): Payer: 59 | Admitting: Certified Nurse Midwife

## 2018-12-21 VITALS — BP 124/80 | HR 68 | Temp 97.1°F | Resp 16 | Wt 186.0 lb

## 2018-12-21 DIAGNOSIS — Z0000 Encounter for general adult medical examination without abnormal findings: Secondary | ICD-10-CM | POA: Diagnosis not present

## 2018-12-21 DIAGNOSIS — N62 Hypertrophy of breast: Secondary | ICD-10-CM

## 2018-12-21 NOTE — Progress Notes (Signed)
Subjective:     Patient ID: Kristina Mendoza, female   DOB: 1995/06/25, 23 y.o.   MRN: [id number]  23 yo gopo single female here to discuss neck and muscle pain related to large breasts bilateral. Patient feels she has changed bra size every year with weight loss. She has noted no size change even with weight loss in the past. She works as Scientist, forensic and bends over constantly and this with breast size increase causes discomfort. She has seen a Psychiatric nurse for consultation and he agreed this was a good choice in light of breast size and discomfort level. Patient has had large breast even as a teenager. Wears size Bra 40 H, previous year was 40 G. Slight scarring from bra straps support. She feels by the end of the day " she wants to rip her bra due to the discomfort. Takes a muscle relaxer at least once a week due to the pull the bra has on neck and shoulders, for pain relief. Review of past office notes from previous provider this has been noted as a problem since 2015. Has tried numerous bras and exercise with no change. " would like to not have pain daily from the weight of my breasts on my back, neck and shoulders". She feels it is embarrassing when working with the public. No other health issues today.    Review of Systems  Constitutional: Negative.   HENT: Negative.   Eyes: Negative.   Respiratory: Negative.   Cardiovascular: Negative.   Gastrointestinal: Negative.   Endocrine: Negative.   Genitourinary: Negative.   Musculoskeletal: Positive for back pain and neck pain.       Shoulder and neck pain with wearing bra to support breasts  Skin: Negative.   Psychiatric/Behavioral: Negative.        Objective:   Physical Exam Constitutional:      Appearance: Normal appearance.  Neck:     Musculoskeletal: Muscular tenderness present.     Comments: Tenderness with ROM  Cardiovascular:     Rate and Rhythm: Normal rate.  Pulmonary:     Effort: Pulmonary effort is normal.   Chest:     Breasts:        Right: Normal. No mass, nipple discharge, skin change or tenderness.        Left: Normal. No mass, nipple discharge, skin change or tenderness.     Comments: Large pendulous breast bilateral Lymphadenopathy:     Upper Body:     Right upper body: No axillary adenopathy.     Left upper body: No axillary adenopathy.  Neurological:     Mental Status: She is alert.  Psychiatric:        Attention and Perception: Attention normal.        Mood and Affect: Mood and affect normal.        Behavior: Behavior normal. Behavior is cooperative.        Cognition and Memory: Cognition normal.        Judgment: Judgment normal.        Assessment:     Pendulous large breasts bilateral with normal breast exam. Shoulder/neck pain from bra use. Bra cup size has increased yearly for the past 3-4 years.  Weight loss in past with no change in size.  Emotionally affected by physical appearance with working with the public in her job    Plan:   Discussed her breast findings and the options she has been working with. Questions addressed. Recommend continued evaluation with  Plastic surgeon and options available to prevent further physical health issues, which also become emotional.  Rv prn, aex

## 2018-12-21 NOTE — Patient Instructions (Signed)
Breast Reduction Breast reduction, also called reduction mammoplasty, is surgery to reduce the size of the breasts by removing fat, tissue, and excess skin. The goal of this procedure is to help relieve problems that are caused or made worse by large breasts, such as:  Poor posture.  Long-term (chronic) back and neck pain.  Difficulty exercising.  A rash on the skin under the breasts.  Breast pain.  Grooves in the shoulder from bra straps.  Difficulty with hygiene.  Psychological distress caused by large breasts. You may get a breast reduction to enhance the appearance of the breasts or for a medical need. Tell a health care provider about:  Any allergies you have.  All medicines you are taking, including vitamins, herbs, eye drops, creams, and over-the-counter medicines.  Any problems you or family members have had with anesthetic medicines.  Any blood disorders you have.  Any surgeries you have had.  Any medical conditions you have.  Whether you are pregnant or may be pregnant. What are the risks? Generally, this is a safe procedure. However, problems may occur, including:  Infection.  Bleeding. Blood may pool near the incision area (hematoma), or blood clots may form.  Allergic reactions to medicines.  Damage to other structures or organs, such as the dark area around the nipple (areola).  Partial or total numbness in the nipple or breast. Sometimes feeling returns, but not always.  Inability to breastfeed.  Pneumonia. What happens before the procedure? Medicines  Ask your health care provider about: ? Changing or stopping your regular medicines. This is especially important if you are taking diabetes medicines or blood thinners. ? Taking medicines such as aspirin and ibuprofen. These medicines can thin your blood. Do not take these medicines before your procedure if your health care provider instructs you not to.  You may be given antibiotic medicine to  help prevent infection. Staying hydrated Follow instructions from your health care provider about hydration, which may include:  Up to 2 hours before the procedure - you may continue to drink clear liquids, such as water, clear fruit juice, black coffee, and plain tea.  Eating and drinking restrictions Follow instructions from your health care provider about eating and drinking, which may include:  8 hours before the procedure - stop eating heavy meals or foods such as meat, fried foods, or fatty foods.  6 hours before the procedure - stop eating light meals or foods, such as toast or cereal.  6 hours before the procedure - stop drinking milk or drinks that contain milk.  2 hours before the procedure - stop drinking clear liquids. General instructions  Stop taking vitamin E supplements 2 weeks before your procedure. Talk with your health care provider about stopping other supplements, herbs, and teas that you regularly take.  Ask your health care provider how your surgical site will be marked or identified.  You may have blood tests.  You may have an X-ray exam of the breasts (mammogram).  You may be asked to bathe using a germ-killing (antiseptic) soap before your procedure.  For at least 2 weeks before your procedure, do not use any products that contain nicotine or tobacco, such as cigarettes and e-cigarettes. These products can delay healing after surgery. If you need help quitting, ask your health care provider.  Plan to have someone take you home from the hospital or clinic.  If you will be going home right after the procedure, plan to have someone with you for 24 hours. What   happens during the procedure?  To lower your risk of infection: ? Your health care team will wash or sanitize their hands. ? Your skin will be washed with soap.  An IV tube will be inserted into one of your veins.  You will be given a medicine to make you fall asleep (general anesthetic). You may  also be given a medicine to help you relax (sedative).  An incision will be made in each breast.  Fat, tissue, and excess skin will be removed from each breast.  Your breasts will be reshaped. Your nipples may be moved so they are centered on your smaller breasts.  Tubes will be placed in your breast tissue to drain fluid from your surgical area. The tubes will be removed 2-3 days after the surgery.  Your incisions will be closed with stitches (sutures) and covered with surgical tape. Your breasts will be covered with gauze and elastic bandages (dressings). The procedure may vary among health care providers and hospitals. What happens after the procedure?  Your blood pressure, heart rate, breathing rate, and blood oxygen level will be monitored until the medicines you were given have worn off.  You may continue to receive fluids and medicines through an IV tube.  You may have to wear compression stockings. These stockings help to prevent blood clots and reduce swelling in your legs.  You will continue to have tubes draining fluid from your surgical area.  You may be given a special bra to wear.  You may have breast pain. You will be given medicine to help relieve pain.  Do not drive for 24 hours if you were given a sedative. Summary  Breast reduction, also called reduction mammoplasty, is surgery to reduce the size of the breasts by removing fat, tissue, and excess skin.  Tubes will be placed in your breast tissue to drain fluid from your surgical area. The tubes will be removed 2-3 days after the surgery.  Your incisions will be closed with stitches (sutures) and covered with surgical tape. Your breasts will be covered with gauze and elastic bandages (dressings).  You may have breast pain. You will be given medicine to help relieve pain.  Plan to have someone take you home from the hospital or clinic. This information is not intended to replace advice given to you by your  health care provider. Make sure you discuss any questions you have with your health care provider. Document Released: 05/06/2008 Document Revised: 01/20/2017 Document Reviewed: 10/20/2015 Elsevier Patient Education  2020 [employer]  

## 2019-01-22 HISTORY — PX: BREAST SURGERY: SHX581

## 2019-02-04 ENCOUNTER — Encounter (HOSPITAL_BASED_OUTPATIENT_CLINIC_OR_DEPARTMENT_OTHER): Payer: Self-pay | Admitting: Specialist

## 2019-02-04 ENCOUNTER — Other Ambulatory Visit: Payer: Self-pay

## 2019-02-07 ENCOUNTER — Other Ambulatory Visit (HOSPITAL_COMMUNITY)
Admission: RE | Admit: 2019-02-07 | Discharge: 2019-02-07 | Disposition: A | Discharge status: Home / Self Care | Payer: Managed Care, Other (non HMO) | Source: Ambulatory Visit | Attending: Specialist | Admitting: Specialist

## 2019-02-07 DIAGNOSIS — Z01812 Encounter for preprocedural laboratory examination: Secondary | ICD-10-CM | POA: Insufficient documentation

## 2019-02-07 DIAGNOSIS — Z20828 Contact with and (suspected) exposure to other viral communicable diseases: Secondary | ICD-10-CM | POA: Diagnosis not present

## 2019-02-08 LAB — NOVEL CORONAVIRUS, NAA (HOSP ORDER, SEND-OUT TO REF LAB; TAT 18-24 HRS): SARS-CoV-2, NAA: NOT DETECTED

## 2019-02-09 ENCOUNTER — Encounter (HOSPITAL_BASED_OUTPATIENT_CLINIC_OR_DEPARTMENT_OTHER): Payer: Self-pay | Admitting: Specialist

## 2019-02-09 NOTE — Anesthesia Preprocedure Evaluation (Addendum)
Anesthesia Evaluation  Patient identified by MRN, date of birth, ID band Patient awake    Reviewed: Allergy & Precautions, NPO status , Patient's Chart, lab work & pertinent test results  Airway Mallampati: II  TM Distance: >3 FB Neck ROM: Full    Dental no notable dental hx. (+) Teeth Intact   Pulmonary asthma , former smoker,    Pulmonary exam normal breath sounds clear to auscultation       Cardiovascular negative cardio ROS Normal cardiovascular exam Rhythm:Regular Rate:Normal     Neuro/Psych negative neurological ROS  negative psych ROS   GI/Hepatic negative GI ROS, Neg liver ROS,   Endo/Other  Macromastia   Renal/GU negative Renal ROS  negative genitourinary   Musculoskeletal Eczema    Abdominal (+) + obese,   Peds  Hematology negative hematology ROS (+)   Anesthesia Other Findings   Reproductive/Obstetrics                            Anesthesia Physical Anesthesia Plan  ASA: II  Anesthesia Plan: General   Post-op Pain Management:    Induction: Intravenous  PONV Risk Score and Plan: 4 or greater and Scopolamine patch - Pre-op, Midazolam, Ondansetron, Dexamethasone and Treatment may vary due to age or medical condition  Airway Management Planned: Oral ETT  Additional Equipment:   Intra-op Plan:   Post-operative Plan: Extubation in OR  Informed Consent: I have reviewed the patients History and Physical, chart, labs and discussed the procedure including the risks, benefits and alternatives for the proposed anesthesia with the patient or authorized representative who has indicated his/her understanding and acceptance.     Dental advisory given  Plan Discussed with: CRNA and Surgeon  Anesthesia Plan Comments:        Anesthesia Quick Evaluation

## 2019-02-11 ENCOUNTER — Ambulatory Visit (HOSPITAL_BASED_OUTPATIENT_CLINIC_OR_DEPARTMENT_OTHER): Payer: No Typology Code available for payment source | Admitting: Anesthesiology

## 2019-02-11 ENCOUNTER — Ambulatory Visit (HOSPITAL_BASED_OUTPATIENT_CLINIC_OR_DEPARTMENT_OTHER)
Admission: RE | Admit: 2019-02-11 | Discharge: 2019-02-11 | Disposition: A | Discharge status: Home / Self Care | Payer: No Typology Code available for payment source | Attending: Specialist | Admitting: Specialist

## 2019-02-11 ENCOUNTER — Encounter (HOSPITAL_BASED_OUTPATIENT_CLINIC_OR_DEPARTMENT_OTHER)
Admission: RE | Disposition: A | Discharge status: Home / Self Care | Payer: Self-pay | Source: Home / Self Care | Attending: Specialist

## 2019-02-11 ENCOUNTER — Other Ambulatory Visit: Payer: Self-pay

## 2019-02-11 ENCOUNTER — Encounter (HOSPITAL_BASED_OUTPATIENT_CLINIC_OR_DEPARTMENT_OTHER): Payer: Self-pay | Admitting: Specialist

## 2019-02-11 DIAGNOSIS — Z87891 Personal history of nicotine dependence: Secondary | ICD-10-CM | POA: Diagnosis not present

## 2019-02-11 DIAGNOSIS — Z79899 Other long term (current) drug therapy: Secondary | ICD-10-CM | POA: Insufficient documentation

## 2019-02-11 DIAGNOSIS — J45909 Unspecified asthma, uncomplicated: Secondary | ICD-10-CM | POA: Diagnosis not present

## 2019-02-11 DIAGNOSIS — M25519 Pain in unspecified shoulder: Secondary | ICD-10-CM | POA: Insufficient documentation

## 2019-02-11 DIAGNOSIS — Z880 Allergy status to penicillin: Secondary | ICD-10-CM | POA: Insufficient documentation

## 2019-02-11 DIAGNOSIS — M542 Cervicalgia: Secondary | ICD-10-CM | POA: Insufficient documentation

## 2019-02-11 DIAGNOSIS — N62 Hypertrophy of breast: Secondary | ICD-10-CM | POA: Insufficient documentation

## 2019-02-11 HISTORY — PX: BREAST REDUCTION SURGERY: SHX8

## 2019-02-11 LAB — POCT PREGNANCY, URINE: Preg Test, Ur: NEGATIVE

## 2019-02-11 SURGERY — MAMMOPLASTY, REDUCTION
Anesthesia: General | Site: Breast | Laterality: Bilateral

## 2019-02-11 MED ORDER — MIDAZOLAM HCL 2 MG/2ML IJ SOLN
INTRAMUSCULAR | Status: AC
  Filled 2019-02-11: qty 2

## 2019-02-11 MED ORDER — DEXAMETHASONE SODIUM PHOSPHATE 10 MG/ML IJ SOLN
INTRAMUSCULAR | Status: DC | PRN
  Administered 2019-02-11: 5 mg via INTRAVENOUS

## 2019-02-11 MED ORDER — BUPIVACAINE HCL (PF) 0.5 % IJ SOLN
INTRAMUSCULAR | Status: AC
  Filled 2019-02-11: qty 30

## 2019-02-11 MED ORDER — ONDANSETRON HCL 4 MG/2ML IJ SOLN
INTRAMUSCULAR | Status: AC
  Filled 2019-02-11: qty 2

## 2019-02-11 MED ORDER — DEXAMETHASONE SODIUM PHOSPHATE 10 MG/ML IJ SOLN
INTRAMUSCULAR | Status: AC
  Filled 2019-02-11: qty 1

## 2019-02-11 MED ORDER — ROCURONIUM BROMIDE 50 MG/5ML IV SOSY
PREFILLED_SYRINGE | INTRAVENOUS | Status: DC | PRN
  Administered 2019-02-11: 100 mg via INTRAVENOUS

## 2019-02-11 MED ORDER — SCOPOLAMINE 1 MG/3DAYS TD PT72
1.0000 | MEDICATED_PATCH | TRANSDERMAL | Status: DC
  Administered 2019-02-11: 07:00:00 1.5 mg via TRANSDERMAL

## 2019-02-11 MED ORDER — MEPERIDINE HCL 25 MG/ML IJ SOLN: 6.2500 mg | INTRAMUSCULAR | Status: DC | PRN

## 2019-02-11 MED ORDER — CIPROFLOXACIN IN D5W 400 MG/200ML IV SOLN
400.0000 mg | INTRAVENOUS | Status: AC
  Administered 2019-02-11: 400 mg via INTRAVENOUS

## 2019-02-11 MED ORDER — HYDROMORPHONE HCL 1 MG/ML IJ SOLN
INTRAMUSCULAR | Status: AC
  Filled 2019-02-11: qty 0.5

## 2019-02-11 MED ORDER — 0.9 % SODIUM CHLORIDE (POUR BTL) OPTIME
TOPICAL | Status: DC | PRN
  Administered 2019-02-11: 09:00:00 400 mL

## 2019-02-11 MED ORDER — LIDOCAINE 2% (20 MG/ML) 5 ML SYRINGE
INTRAMUSCULAR | Status: AC
  Filled 2019-02-11: qty 5

## 2019-02-11 MED ORDER — FENTANYL CITRATE (PF) 100 MCG/2ML IJ SOLN
INTRAMUSCULAR | Status: AC
  Filled 2019-02-11: qty 2

## 2019-02-11 MED ORDER — CEFAZOLIN SODIUM-DEXTROSE 2-4 GM/100ML-% IV SOLN
INTRAVENOUS | Status: AC
  Filled 2019-02-11: qty 100

## 2019-02-11 MED ORDER — CEFAZOLIN SODIUM-DEXTROSE 2-4 GM/100ML-% IV SOLN: 2.0000 g | INTRAVENOUS | Status: DC

## 2019-02-11 MED ORDER — FENTANYL CITRATE (PF) 250 MCG/5ML IJ SOLN
INTRAMUSCULAR | Status: DC | PRN
  Administered 2019-02-11: 100 ug via INTRAVENOUS
  Administered 2019-02-11 (×2): 50 ug via INTRAVENOUS
  Administered 2019-02-11: 100 ug via INTRAVENOUS

## 2019-02-11 MED ORDER — LACTATED RINGERS IV SOLN: INTRAVENOUS | Status: DC | PRN

## 2019-02-11 MED ORDER — PROPOFOL 10 MG/ML IV BOLUS
INTRAVENOUS | Status: DC | PRN
  Administered 2019-02-11: 200 mg via INTRAVENOUS

## 2019-02-11 MED ORDER — CHLORHEXIDINE GLUCONATE CLOTH 2 % EX PADS: 6.0000 | MEDICATED_PAD | Freq: Once | CUTANEOUS | Status: DC

## 2019-02-11 MED ORDER — SODIUM BICARBONATE 4 % IV SOLN
INTRAVENOUS | Status: DC | PRN
  Administered 2019-02-11: 12.5 mL via INTRAVENOUS

## 2019-02-11 MED ORDER — SUGAMMADEX SODIUM 200 MG/2ML IV SOLN
INTRAVENOUS | Status: DC | PRN
  Administered 2019-02-11: 160 mg via INTRAVENOUS

## 2019-02-11 MED ORDER — EPINEPHRINE PF 1 MG/ML IJ SOLN
INTRAMUSCULAR | Status: AC
  Filled 2019-02-11: qty 1

## 2019-02-11 MED ORDER — SODIUM BICARBONATE 4.2 % IV SOLN
INTRAVENOUS | Status: AC
  Filled 2019-02-11: qty 30

## 2019-02-11 MED ORDER — BUPIVACAINE HCL (PF) 0.25 % IJ SOLN
INTRAMUSCULAR | Status: AC
  Filled 2019-02-11: qty 30

## 2019-02-11 MED ORDER — LIDOCAINE 2% (20 MG/ML) 5 ML SYRINGE
INTRAMUSCULAR | Status: DC | PRN
  Administered 2019-02-11: 100 mg via INTRAVENOUS

## 2019-02-11 MED ORDER — ROCURONIUM BROMIDE 10 MG/ML (PF) SYRINGE
PREFILLED_SYRINGE | INTRAVENOUS | Status: AC
  Filled 2019-02-11: qty 10

## 2019-02-11 MED ORDER — METOCLOPRAMIDE HCL 5 MG/ML IJ SOLN: 10.0000 mg | Freq: Once | INTRAMUSCULAR | Status: DC | PRN

## 2019-02-11 MED ORDER — LIDOCAINE HCL 2 % IJ SOLN
INTRAMUSCULAR | Status: AC
  Filled 2019-02-11: qty 20

## 2019-02-11 MED ORDER — ONDANSETRON HCL 4 MG/2ML IJ SOLN
INTRAMUSCULAR | Status: DC | PRN
  Administered 2019-02-11: 4 mg via INTRAVENOUS

## 2019-02-11 MED ORDER — PROPOFOL 10 MG/ML IV BOLUS
INTRAVENOUS | Status: AC
  Filled 2019-02-11: qty 40

## 2019-02-11 MED ORDER — HYDROMORPHONE HCL 1 MG/ML IJ SOLN
0.2500 mg | INTRAMUSCULAR | Status: DC | PRN
  Administered 2019-02-11 (×2): 0.5 mg via INTRAVENOUS

## 2019-02-11 MED ORDER — FENTANYL CITRATE (PF) 100 MCG/2ML IJ SOLN: 50.0000 ug | INTRAMUSCULAR | Status: DC | PRN

## 2019-02-11 MED ORDER — OXYCODONE HCL 5 MG/5ML PO SOLN: 5.0000 mg | Freq: Once | ORAL | Status: DC | PRN

## 2019-02-11 MED ORDER — MIDAZOLAM HCL 2 MG/2ML IJ SOLN
1.0000 mg | INTRAMUSCULAR | Status: DC | PRN
Start: 2019-02-11 — End: 2019-02-11
  Administered 2019-02-11: 2 mg via INTRAVENOUS

## 2019-02-11 MED ORDER — LACTATED RINGERS IV SOLN: INTRAVENOUS | Status: DC

## 2019-02-11 MED ORDER — CIPROFLOXACIN IN D5W 400 MG/200ML IV SOLN
INTRAVENOUS | Status: AC
  Filled 2019-02-11: qty 200

## 2019-02-11 MED ORDER — OXYCODONE HCL 5 MG PO TABS: 5.0000 mg | ORAL_TABLET | Freq: Once | ORAL | Status: DC | PRN

## 2019-02-11 MED ORDER — LIDOCAINE HCL 2 % IJ SOLN
INTRAVENOUS | Status: DC | PRN
  Administered 2019-02-11: 08:00:00 1000 mL

## 2019-02-11 SURGICAL SUPPLY — 70 items
APL SKNCLS STERI-STRIP NONHPOA (GAUZE/BANDAGES/DRESSINGS) ×2
BAG DECANTER FOR FLEXI CONT (MISCELLANEOUS) IMPLANT
BENZOIN TINCTURE PRP APPL 2/3 (GAUZE/BANDAGES/DRESSINGS) ×6 IMPLANT
BINDER BREAST XLRG (GAUZE/BANDAGES/DRESSINGS) IMPLANT
BINDER BREAST XXLRG (GAUZE/BANDAGES/DRESSINGS) ×3 IMPLANT
BLADE KNIFE PERSONA 10 (BLADE) ×6 IMPLANT
BLADE KNIFE PERSONA 15 (BLADE) ×3 IMPLANT
CANISTER SUCT 1200ML W/VALVE (MISCELLANEOUS) ×3 IMPLANT
COVER BACK TABLE REUSABLE LG (DRAPES) ×3 IMPLANT
COVER MAYO STAND REUSABLE (DRAPES) ×3 IMPLANT
COVER WAND RF STERILE (DRAPES) IMPLANT
DECANTER SPIKE VIAL GLASS SM (MISCELLANEOUS) IMPLANT
DRAIN CHANNEL 10F 3/8 F FF (DRAIN) ×6 IMPLANT
DRAPE LAPAROSCOPIC ABDOMINAL (DRAPES) ×3 IMPLANT
DRSG PAD ABDOMINAL 8X10 ST (GAUZE/BANDAGES/DRESSINGS) ×12 IMPLANT
ELECT BLADE 4.0 EZ CLEAN MEGAD (MISCELLANEOUS) ×3
ELECT REM PT RETURN 9FT ADLT (ELECTROSURGICAL) ×3
ELECTRODE BLDE 4.0 EZ CLN MEGD (MISCELLANEOUS) ×1 IMPLANT
ELECTRODE REM PT RTRN 9FT ADLT (ELECTROSURGICAL) ×1 IMPLANT
EVACUATOR SILICONE 100CC (DRAIN) ×6 IMPLANT
GAUZE SPONGE 4X4 12PLY STRL (GAUZE/BANDAGES/DRESSINGS) ×6 IMPLANT
GAUZE XEROFORM 5X9 LF (GAUZE/BANDAGES/DRESSINGS) ×6 IMPLANT
GLOVE BIO SURGEON STRL SZ 6.5 (GLOVE) IMPLANT
GLOVE BIO SURGEONS STRL SZ 6.5 (GLOVE)
GLOVE BIOGEL M STRL SZ7.5 (GLOVE) ×6 IMPLANT
GLOVE BIOGEL PI IND STRL 7.5 (GLOVE) ×1 IMPLANT
GLOVE BIOGEL PI IND STRL 8 (GLOVE) ×2 IMPLANT
GLOVE BIOGEL PI INDICATOR 7.5 (GLOVE) ×2
GLOVE BIOGEL PI INDICATOR 8 (GLOVE) ×4
GLOVE ECLIPSE 7.0 STRL STRAW (GLOVE) ×3 IMPLANT
GOWN STRL REUS W/ TWL XL LVL3 (GOWN DISPOSABLE) ×2 IMPLANT
GOWN STRL REUS W/TWL XL LVL3 (GOWN DISPOSABLE) ×4
IV NS 500ML (IV SOLUTION)
IV NS 500ML BAXH (IV SOLUTION) IMPLANT
MARKER SKIN DUAL TIP RULER LAB (MISCELLANEOUS) ×3 IMPLANT
NEEDLE SPNL 18GX3.5 QUINCKE PK (NEEDLE) ×3 IMPLANT
NS IRRIG 1000ML POUR BTL (IV SOLUTION) ×6 IMPLANT
PACK BASIN DAY SURGERY FS (CUSTOM PROCEDURE TRAY) ×3 IMPLANT
PAD ALCOHOL SWAB (MISCELLANEOUS) ×6 IMPLANT
PEN SKIN MARKING BROAD TIP (MISCELLANEOUS) ×6 IMPLANT
PENCIL SMOKE EVACUATOR (MISCELLANEOUS) ×3 IMPLANT
PIN SAFETY STERILE (MISCELLANEOUS) ×3 IMPLANT
SHEETING SILICONE GEL EPI DERM (MISCELLANEOUS) IMPLANT
SLEEVE SCD COMPRESS KNEE MED (MISCELLANEOUS) ×3 IMPLANT
SLEEVE SURGEON STRL (DRAPES) ×3 IMPLANT
SPECIMEN JAR MEDIUM (MISCELLANEOUS) IMPLANT
SPECIMEN JAR X LARGE (MISCELLANEOUS) ×6 IMPLANT
SPONGE LAP 18X18 RF (DISPOSABLE) ×12 IMPLANT
STRIP SUTURE WOUND CLOSURE 1/2 (MISCELLANEOUS) ×15 IMPLANT
SUT MNCRL AB 3-0 PS2 18 (SUTURE) ×18 IMPLANT
SUT MON AB 2-0 CT1 36 (SUTURE) IMPLANT
SUT MON AB 5-0 PS2 18 (SUTURE) ×6 IMPLANT
SUT PROLENE 3 0 PS 2 (SUTURE) ×18 IMPLANT
SUT VLOC 180 0 24IN GS25 (SUTURE) IMPLANT
SUT VLOC 90 P-14 23 (SUTURE) ×6 IMPLANT
SYR 50ML LL SCALE MARK (SYRINGE) IMPLANT
SYR CONTROL 10ML LL (SYRINGE) IMPLANT
SYR TB 1ML LL NO SAFETY (SYRINGE) ×3 IMPLANT
TAPE HYPAFIX 6 X30' (GAUZE/BANDAGES/DRESSINGS) ×1
TAPE HYPAFIX 6X30 (GAUZE/BANDAGES/DRESSINGS) ×2 IMPLANT
TAPE MEASURE 72IN RETRACT (INSTRUMENTS) ×2
TAPE MEASURE LINEN 72IN RETRCT (INSTRUMENTS) ×1 IMPLANT
TAPE PAPER 1X10 WHT MICROPORE (GAUZE/BANDAGES/DRESSINGS) ×3 IMPLANT
TOWEL GREEN STERILE FF (TOWEL DISPOSABLE) ×12 IMPLANT
TUBE CONNECTING 20'X1/4 (TUBING) ×1
TUBE CONNECTING 20X1/4 (TUBING) ×2 IMPLANT
TUBING INFILTRATION IT-10001 (TUBING) ×3 IMPLANT
TUBING SET GRADUATE ASPIR 12FT (MISCELLANEOUS) IMPLANT
UNDERPAD 30X36 HEAVY ABSORB (UNDERPADS AND DIAPERS) ×6 IMPLANT
YANKAUER SUCT BULB TIP NO VENT (SUCTIONS) ×3 IMPLANT

## 2019-02-11 NOTE — Brief Op Note (Signed)
02/11/2019  10:08 AM  PATIENT:  Kristina Mendoza  23 y.o. female  PRE-OPERATIVE DIAGNOSIS:  MACROMASTIA  POST-OPERATIVE DIAGNOSIS:  MACROMASTIA  PROCEDURE:  Procedure(s): MAMMARY REDUCTION  (BREAST) (Bilateral)  SURGEON:  Surgeon(s) and Role:    * Cristine Polio, MD - Primary  PHYSICIAN ASSISTANT:   ASSISTANTS: none   ANESTHESIA:   general  EBL:  125 mL   BLOOD ADMINISTERED:none  DRAINS: (hhh) Jackson-Pratt drain(s) with closed bulb suction in the no 10 bilaterally   LOCAL MEDICATIONS USED:  LIDOCAINE   SPECIMEN:  Excision  DISPOSITION OF SPECIMEN:  PATHOLOGY  COUNTS:  YES  TOURNIQUET:  * No tourniquets in log *  DICTATION: .Other Dictation: Dictation Number [id number]  PLAN OF CARE: Discharge to home after PACU  PATIENT DISPOSITION:  PACU - hemodynamically stable.   Delay start of Pharmacological VTE agent (>24hrs) due to surgical blood loss or risk of bleeding: yes

## 2019-02-11 NOTE — Transfer of Care (Signed)
Immediate Anesthesia Transfer of Care Note  Patient: Kristina Mendoza  Procedure(s) Performed: MAMMARY REDUCTION  (BREAST) (Bilateral Breast)  Patient Location: PACU  Anesthesia Type:General  Level of Consciousness: awake, alert , patient cooperative and responds to stimulation  Airway & Oxygen Therapy: Patient Spontanous Breathing and Patient connected to nasal cannula oxygen  Post-op Assessment: Report given to RN, Post -op Vital signs reviewed and stable and Patient moving all extremities X 4  Post vital signs: Reviewed and stable  Last Vitals:  Vitals Value Taken Time  BP 139/68 02/11/19 1020  Temp    Pulse 105 02/11/19 1022  Resp 0 02/11/19 1022  SpO2 100 % 02/11/19 1022  Vitals shown include unvalidated device data.  Last Pain:  Vitals:   02/11/19 0637  TempSrc: Tympanic  PainSc: 0-No pain      Patients Stated Pain Goal: 5 (45/80/99 8338)  Complications: No apparent anesthesia complications

## 2019-02-11 NOTE — H&P (Signed)
Kristina Mendoza is an 23 y.o. female.   Chief Complaint: Severe macromastia HPI: Increased back and shoulder pain neck pain  Past Medical History:  Diagnosis Date  . Asthma   . Eczema   . Seasonal allergies     Past Surgical History:  Procedure Laterality Date  . WISDOM TOOTH EXTRACTION      Family History  Problem Relation Age of Onset  . Colon cancer Mother 4       stage II/III  . Alzheimer's disease Other   . Hypertension Other   . Diabetes Other   . Colon cancer Maternal Grandmother 51  . Heart disease Maternal Grandmother   . Dementia Paternal Grandmother   . Deafness Paternal Grandmother   . Colon cancer Maternal Uncle 60       stage II great uncle  . Other Maternal Uncle        heart problems under age 82   Social History:  reports that she has quit smoking. She has never used smokeless tobacco. She reports previous alcohol use. She reports that she does not use drugs.  Allergies:  Allergies  Allergen Reactions  . Penicillins     Medications Prior to Admission  Medication Sig Dispense Refill  . albuterol (VENTOLIN HFA) 108 (90 Base) MCG/ACT inhaler TAKE 2 PUFFS BY MOUTH EVERY 4 HOURS AS NEEDED    . desogestrel-ethinyl estradiol (VIORELE) 0.15-0.02/0.01 MG (21/5) tablet Take 1 tablet by mouth daily. 84 tablet 0  . levocetirizine (XYZAL) 5 MG tablet Take 5 mg by mouth every evening.    . Multiple Vitamin (MULTIVITAMIN) tablet Take 1 tablet by mouth daily.    Marland Kitchen triamcinolone cream (KENALOG) 0.5 % APPLY 1 APPLICATION TO AFFECTED AREA ONCE A DAY AS NEEDED EXTERNALLY  1  . albuterol (PROVENTIL) (2.5 MG/3ML) 0.083% nebulizer solution USE 3 ML THREE TIMES A DAY AS NEEDED FOR ASTHMA EXACERBATION INHALATION    . cyclobenzaprine (FLEXERIL) 10 MG tablet Take by mouth.      Results for orders placed or performed during the hospital encounter of 02/11/19 (from the past 48 hour(s))  Pregnancy, urine POC     Status: None   Collection Time: 02/11/19  6:30 AM  Result  Value Ref Range   Preg Test, Ur NEGATIVE NEGATIVE    Comment:        THE SENSITIVITY OF THIS METHODOLOGY IS >24 mIU/mL    No results found.  Review of Systems  Constitutional: Negative.   HENT: Negative.   Eyes: Negative.   Respiratory: Negative.   Gastrointestinal: Negative.   Endocrine: Negative.   Genitourinary: Negative.   Musculoskeletal: Positive for back pain, myalgias and neck pain.  Skin: Positive for rash.  Allergic/Immunologic: Negative.  Negative for food allergies.  Neurological: Negative.   Hematological: Negative.   Psychiatric/Behavioral: Negative.     Blood pressure 123/87, pulse (!) 104, temperature (!) 96.6 F (35.9 C), temperature source Tympanic, resp. rate 18, height 5\' 1"  (1.549 m), weight 87.1 kg, last menstrual period 02/06/2019, SpO2 99 %. Physical Exam  Constitutional: She appears well-developed and well-nourished.  HENT:  Head: Normocephalic and atraumatic.  Eyes: Pupils are equal, round, and reactive to light. EOM are normal.  Cardiovascular: Regular rhythm.  Respiratory: Breath sounds normal.  GI: Soft. Bowel sounds are normal.  Musculoskeletal:        General: Normal range of motion.     Cervical back: Normal range of motion.  Neurological: She is alert.  Skin: Skin is warm.  Psychiatric:  She has a normal mood and affect.     Assessment/Plan Severe macromasytia for bilateral breast reductions and excision accessory breasat tissue  Adean Milosevic L, MD 02/11/2019, 7:23 AM

## 2019-02-11 NOTE — Op Note (Signed)
NAME: Kristina Mendoza, Kristina Mendoza MEDICAL RECORD ZO:[id number] ACCOUNT [id number] DATE OF BIRTH:1995-08-27 FACILITY: [facility] LOCATION: [facility] PHYSICIAN:Marcina Kinnison Octavia Heir, MD  OPERATIVE REPORT  DATE OF PROCEDURE:  02/11/2019  INDICATIONS:  This is a 23 year old lady with severe-severe macromastia, back and shoulder pain secondary to large pendulous breasts as well as increased excessive breast tissue causing pain and discomfort.  PROCEDURES DONE:  Planned bilateral breast reductions using the inferior pedicle technique.  ANESTHESIA:  General.  PROCEDURE IN DETAIL:  Procedure in detail was explained to the patient preoperatively.  The patient asked questions and they were answered and she consents to surgery.  The patient was taken to the operating room and placed on the operating room table in  the sitting position, underwent drawings for the reduction mammoplasty using the inferior pedicle technique.  We re-marked her back to 22 cm from the area she was at 37.  She then underwent general anesthesia, intubated orally.  Prep was done to the  chest, breast areas in routine fashion using Hibiclens soap and solution walled off with sterile towels and drapes so as make a sterile field.  Tumescent solution was injected 500 cc per side and allowed to sit.  The wounds were scored with a #15 blade  and the skin over the inferior pedicle was deepithelialized with a #20 blade.  Medial and lateral fatty dermal pedicles were excised down to underlying pectoralis major fascia.  After proper hemostasis, more excessive breast tissue was removed laterally  and the excessive breast tissue.  New keyhole area was also debulked.  After proper hemostasis, the flaps were rotated and stayed with 3-0 Prolene suture.  Subcutaneous closure was done with 3-0 Monocryl x2 layers and then a running subcuticular stitch  of 3-0 Monocryl, 5-0 Monocryl throughout the inverted T.  The wounds were drained and #10 Blake drains were  placed in the depths of the wound and brought out the lateral most portion of the incision and secured with 3-0 Prolene suture.  The wounds were  cleansed.  Sterile dressings were applied including Steri-Strips, soft dressings, ABDs, Hypafix tape, and wrap.  ESTIMATED BLOOD LOSS:  150 mL.  COMPLICATIONS:  None.  The patient was then taken to the recovery room in excellent condition.  TN/NUANCE  D:02/11/2019 T:02/11/2019 JOB:[id number]

## 2019-02-11 NOTE — Discharge Instructions (Signed)
Breast Reduction °Care After °Refer to this sheet in the next few weeks. These instructions provide you with information on caring for yourself after your procedure. Your caregiver may also give you more specific instructions. Your treatment has been planned according to current medical practices, but problems sometimes occur. Call your caregiver if you have any problems or questions after your procedure. °HOME CARE INSTRUCTIONS °· Do not lift more than 5 pounds with one arm, or 10 pounds with both arms, for 1 month.  °· Do not sleep on your stomach for 4 to 6 weeks.  °· Do not do vigorous exercise such as bouncing, aerobics, or jumping for 6 weeks. Walking is not restricted.  °· Do not drive while you are taking prescription pain medicine.  °· Avoid prolonged sun exposure.  °· Keep dressings dry and clean °· Measure jp drainage every 12 hrs and measure  °· You may slowly go back to your normal diet. Start with a light meal and increase as comfortable.  °· You may shower 24 hours after your drains are removed unless instructed differently by your caregiver.  °· Take your pain medicine as prescribed. Discomfort is normal after breast reduction surgery.  °· Keep the head of your bed elevated 40 degrees °·  °: Call the office if you notice: °· You have a fever.  °· You notice drainage from the incision that smells bad.  °· You have persistent pain.  °· You have persistent bleeding from the incision or nipple discharge.  °· You develop increased swelling or swelling that is greater in one breast than in the other.  °MAKE SURE YOU:  °· Understand these instructions.  °· Will watch your condition.  °· Will get help right away if you are not doing well or get worse.  °Document Released: 09/22/2003 Document Revised: 10/20/2010 Document Reviewed: 05/03/2007 °Exit[employer]® Patient Information ©2012 [employer]. ° ° °Post Anesthesia Home Care Instructions ° °Activity: °Get plenty of rest for the remainder of the day. A  responsible individual must stay with you for 24 hours following the procedure.  °For the next 24 hours, DO NOT: °-Drive a car °-Operate machinery °-Drink alcoholic beverages °-Take any medication unless instructed by your physician °-Make any legal decisions or sign important papers. ° °Meals: °Start with liquid foods such as gelatin or soup. Progress to regular foods as tolerated. Avoid greasy, spicy, heavy foods. If nausea and/or vomiting occur, drink only clear liquids until the nausea and/or vomiting subsides. Call your physician if vomiting continues. ° °Special Instructions/Symptoms: °Your throat may feel dry or sore from the anesthesia or the breathing tube placed in your throat during surgery. If this causes discomfort, gargle with warm salt water. The discomfort should disappear within 24 hours. ° °If you had a scopolamine patch placed behind your ear for the management of post- operative nausea and/or vomiting: ° °1. The medication in the patch is effective for 72 hours, after which it should be removed.  Wrap patch in a tissue and discard in the trash. Wash hands thoroughly with soap and water. °2. You may remove the patch earlier than 72 hours if you experience unpleasant side effects which may include dry mouth, dizziness or visual disturbances. °3. Avoid touching the patch. Wash your hands with soap and water after contact with the patch. ° ° °About my Jackson-Pratt Bulb Drain ° °What is a Jackson-Pratt bulb? °A Jackson-Pratt is a soft, round device used to collect drainage. It is connected to a long, thin   drainage catheter, which is held in place by one or two small stiches near your surgical incision site. When the bulb is squeezed, it forms a vacuum, forcing the drainage to empty into the bulb. ° °Emptying the Jackson-Pratt bulb- °To empty the bulb: °1. Release the plug on the top of the bulb. °2. Pour the bulb's contents into a measuring container which your nurse will provide. °3. Record the time  emptied and amount of drainage. Empty the drain(s) as often as your     doctor or nurse recommends. ° °Date                  Time                    Amount (Drain 1)                 Amount (Drain 2) ° °_____________________________________________________________________ ° °_____________________________________________________________________ ° °_____________________________________________________________________ ° °_____________________________________________________________________ ° °_____________________________________________________________________ ° °_____________________________________________________________________ ° °_____________________________________________________________________ ° °_____________________________________________________________________ ° °Squeezing the Jackson-Pratt Bulb- °To squeeze the bulb: °1. Make sure the plug at the top of the bulb is open. °2. Squeeze the bulb tightly in your fist. You will hear air squeezing from the bulb. °3. Replace the plug while the bulb is squeezed. °4. Use a safety pin to attach the bulb to your clothing. This will keep the catheter from     pulling at the bulb insertion site. ° °When to call your doctor- °Call your doctor if: °· Drain site becomes red, swollen or hot. °· You have a fever greater than 101 degrees F. °· There is oozing at the drain site. °· Drain falls out (apply a guaze bandage over the drain hole and secure it with tape). °· Drainage increases daily not related to activity patterns. (You will usually have more drainage when you are active than when you are resting.) °· Drainage has a bad odor. ° ° °  ° °

## 2019-02-11 NOTE — Anesthesia Procedure Notes (Signed)
Procedure Name: Intubation Date/Time: 02/11/2019 7:54 AM Performed by: Glynda Jaeger, CRNA Pre-anesthesia Checklist: Patient identified, Patient being monitored, Timeout performed, Emergency Drugs available and Suction available Patient Re-evaluated:Patient Re-evaluated prior to induction Oxygen Delivery Method: Circle System Utilized Preoxygenation: Pre-oxygenation with 100% oxygen Induction Type: IV induction Ventilation: Mask ventilation without difficulty Laryngoscope Size: Mac and 4 Grade View: Grade I Tube type: Oral Tube size: 7.0 mm Number of attempts: 1 Airway Equipment and Method: Stylet Placement Confirmation: ETT inserted through vocal cords under direct vision,  positive ETCO2 and breath sounds checked- equal and bilateral Secured at: 21 cm Tube secured with: Tape Dental Injury: Teeth and Oropharynx as per pre-operative assessment

## 2019-02-11 NOTE — Anesthesia Postprocedure Evaluation (Signed)
Anesthesia Post Note  Patient: Cornella R Brusca  Procedure(s) Performed: MAMMARY REDUCTION  (BREAST) (Bilateral Breast)     Patient location during evaluation: PACU Anesthesia Type: General Level of consciousness: awake and alert and oriented Pain management: pain level controlled Vital Signs Assessment: post-procedure vital signs reviewed and stable Respiratory status: spontaneous breathing, nonlabored ventilation and respiratory function stable Cardiovascular status: blood pressure returned to baseline and stable Postop Assessment: no apparent nausea or vomiting Anesthetic complications: no    Last Vitals:  Vitals:   02/11/19 1100 02/11/19 1115  BP: 113/74 115/69  Pulse: 69 71  Resp: 12 19  Temp:    SpO2: 100% 97%    Last Pain:  Vitals:   02/11/19 1100  TempSrc:   PainSc: 3                  Sicily Zaragoza A.

## 2019-02-18 LAB — SURGICAL PATHOLOGY

## 2019-04-29 ENCOUNTER — Other Ambulatory Visit: Payer: Self-pay | Admitting: Certified Nurse Midwife

## 2019-04-29 DIAGNOSIS — Z3041 Encounter for surveillance of contraceptive pills: Secondary | ICD-10-CM

## 2019-04-29 MED ORDER — DESOGESTREL-ETHINYL ESTRADIOL 0.15-0.02/0.01 MG (21/5) PO TABS: 1.0000 | ORAL_TABLET | Freq: Every day | ORAL | 0 refills | Status: DC

## 2019-04-29 NOTE — Telephone Encounter (Signed)
Patient is calling to request refill for Viorele. Patient updated pharmacy to [facility] on [address] in [place], [place]. Pharmacy updated in Epic.

## 2019-04-29 NOTE — Telephone Encounter (Signed)
Medication refill request: Viorele  Last AEX:  08-06-2018 DL  Next AEX: 07-28-98 Last MMG (if hormonal medication request): n/a Refill authorized: Today, please advise.   Medication pended for #[id number]. Please refill if appropriate.

## 2019-05-06 ENCOUNTER — Encounter: Payer: Self-pay | Admitting: Certified Nurse Midwife

## 2019-05-08 ENCOUNTER — Encounter: Payer: Self-pay | Admitting: Certified Nurse Midwife

## 2019-07-19 ENCOUNTER — Other Ambulatory Visit: Payer: Self-pay

## 2019-07-19 DIAGNOSIS — Z3041 Encounter for surveillance of contraceptive pills: Secondary | ICD-10-CM

## 2019-07-19 NOTE — Telephone Encounter (Signed)
Medication refill request: Kristina Mendoza Last AEX:  08-06-2018 Next AEX: 08-13-2019 Last MMG (if hormonal medication request): none Refill authorized: please approve if appropriate

## 2019-07-20 MED ORDER — DESOGESTREL-ETHINYL ESTRADIOL 0.15-0.02/0.01 MG (21/5) PO TABS: 1.0000 | ORAL_TABLET | Freq: Every day | ORAL | 0 refills | Status: DC

## 2019-07-24 ENCOUNTER — Telehealth: Payer: Self-pay

## 2019-07-24 NOTE — Telephone Encounter (Signed)
Patient is calling in regards to birth control medication refill. Patient stated [facility] said the prescription was not authorized.

## 2019-07-24 NOTE — Telephone Encounter (Signed)
Spoke with patient and advised that prescription was sent to pharmacy 07/20/19. [facility] [place], [place]. Per patient, [facility] [address] Advised patient to call the pharmacy and have them switch it over in their system. Okay to close encounter.

## 2019-08-07 ENCOUNTER — Ambulatory Visit: Payer: 59 | Admitting: Certified Nurse Midwife

## 2019-08-12 ENCOUNTER — Other Ambulatory Visit: Payer: Self-pay

## 2019-08-12 NOTE — Progress Notes (Signed)
24 y.o. G0P0000 Single African American female here for annual exam.    Patient complaining of vaginal irritation/itching. She states switched soaps 3-4 weeks ago and developed symptoms of yeast infection. She self treated with OTC Monistat and symptoms resolved. Symptoms are currently returning.  She declines vaginitis and STD testing.  Not sexually active for 2 years.   Been on birth control for a while.  She is noticing acne. Thinking about stopping pills.  States her menses are really light on birth control.  Teaching and public speaking.   Completed her Covid vaccine.   PCP: Donald Prose, MD  Patient's last menstrual period was 07/17/2019 (approximate).     Period Cycle (Days): 30 Period Duration (Days): 5 days Period Pattern: Regular Menstrual Flow: Light Menstrual Control: Tampon, Panty liner Dysmenorrhea: None     Sexually active: No.  The current method of family planning is OCP (estrogen/progesterone).    Exercising: Yes.    strength training and cardio. Smoker:  Occ. Marijuana--weekly  Health Maintenance: Pap: 08-06-18 Neg, 11-18-14 neg History of abnormal Pap:  no MMG:  n/a Colonoscopy:  n/a BMD:   n/a  Result  n/a TDaP:  2018 Gardasil:   Yes, completed HIV: 11-24-15 NR Hep C:no Screening Labs:   PCP.    reports that she has quit smoking. She has never used smokeless tobacco. She reports previous alcohol use. She reports current drug use. Frequency: 1.00 time per week. Drug: Marijuana.  Past Medical History:  Diagnosis Date  . Asthma   . Eczema   . Seasonal allergies     Past Surgical History:  Procedure Laterality Date  . BREAST REDUCTION SURGERY Bilateral 02/11/2019   Procedure: MAMMARY REDUCTION  (BREAST);  Surgeon: Cristine Polio, MD;  Location: [place] [facility];  Service: Plastics;  Laterality: Bilateral;  . BREAST SURGERY  01/2019   breast reduction  . WISDOM TOOTH EXTRACTION      Current Outpatient Medications  Medication Sig  Dispense Refill  . albuterol (PROVENTIL) (2.5 MG/3ML) 0.083% nebulizer solution USE 3 ML THREE TIMES A DAY AS NEEDED FOR ASTHMA EXACERBATION INHALATION    . albuterol (VENTOLIN HFA) 108 (90 Base) MCG/ACT inhaler TAKE 2 PUFFS BY MOUTH EVERY 4 HOURS AS NEEDED    . cyclobenzaprine (FLEXERIL) 10 MG tablet Take by mouth.    . desogestrel-ethinyl estradiol (VIORELE) 0.15-0.02/0.01 MG (21/5) tablet Take 1 tablet by mouth daily. 94 tablet 3  . levocetirizine (XYZAL) 5 MG tablet Take 5 mg by mouth every evening.    . Multiple Vitamin (MULTIVITAMIN WITH MINERALS) TABS tablet Take 1 tablet by mouth daily.    Marland Kitchen triamcinolone cream (KENALOG) 0.5 % APPLY 1 APPLICATION TO AFFECTED AREA ONCE A DAY AS NEEDED EXTERNALLY  1  . triamcinolone ointment (KENALOG) 0.5 % APPLY TO AFFECTED AREA TWICE A DAY AS NEEDED     No current facility-administered medications for this visit.    Family History  Problem Relation Age of Onset  . Colon cancer Mother 67       stage II/III  . Alzheimer's disease Other   . Hypertension Other   . Diabetes Other   . Colon cancer Maternal Grandmother 39  . Heart disease Maternal Grandmother   . Dementia Paternal Grandmother   . Deafness Paternal Grandmother   . Colon cancer Maternal Uncle 60       stage II great uncle  . Other Maternal Uncle        heart problems under age 73  Review of Systems  All other systems reviewed and are negative.   Exam:   BP 122/76 (Cuff Size: Large)   Pulse 90   Temp (!) 97.1 F (36.2 C) (Temporal)   Resp 16   Ht 5\' 1"  (1.549 m)   Wt 185 lb 9.6 oz (84.2 kg)   LMP 07/17/2019 (Approximate)   BMI 35.07 kg/m     General appearance: alert, cooperative and appears stated age Head: normocephalic, without obvious abnormality, atraumatic Neck: no adenopathy, supple, symmetrical, trachea midline and thyroid normal to inspection and palpation Lungs: clear to auscultation bilaterally Breasts: consistent with reduction, no masses or tenderness, No  nipple retraction or dimpling, No nipple discharge or bleeding, No axillary adenopathy Heart: regular rate and rhythm Abdomen: soft, non-tender; no masses, no organomegaly Extremities: extremities normal, atraumatic, no cyanosis or edema Skin: skin color, texture, turgor normal. No rashes or lesions Lymph nodes: cervical, supraclavicular, and axillary nodes normal. Neurologic: grossly normal  Pelvic: External genitalia:  no lesions              No abnormal inguinal nodes palpated.              Urethra:  normal appearing urethra with no masses, tenderness or lesions              Bartholins and Skenes: normal                 Vagina: normal appearing vagina with normal color and discharge, no lesions              Cervix: no lesions              Pap taken: Yes.   Bimanual Exam:  Uterus:  normal size, contour, position, consistency, mobility, non-tender              Adnexa: no mass, fullness, tenderness            Chaperone was present for exam.  Assessment:   Well woman visit with normal exam. Status post bilateral breast reduction.  Strong FH colon cancer.   She has been advised in the past to start colonoscopy at age 70.  Plan: Mammogram screening age 21.  Self breast awareness reviewed. Pap and HR HPV as above. Guidelines for Calcium, Vitamin D, regular exercise program including cardiovascular and weight bearing exercise. Refill of COCs for one year.  She wants to continue for now.  Referral for genetic counseling and testing.  Follow up annually and prn.   After visit summary provided.

## 2019-08-13 ENCOUNTER — Ambulatory Visit: Payer: No Typology Code available for payment source | Admitting: Obstetrics and Gynecology

## 2019-08-13 ENCOUNTER — Encounter: Payer: Self-pay | Admitting: Obstetrics and Gynecology

## 2019-08-13 ENCOUNTER — Other Ambulatory Visit (HOSPITAL_COMMUNITY)
Admission: RE | Admit: 2019-08-13 | Discharge: 2019-08-13 | Disposition: A | Discharge status: Home / Self Care | Payer: Managed Care, Other (non HMO) | Source: Ambulatory Visit | Attending: Obstetrics and Gynecology | Admitting: Obstetrics and Gynecology

## 2019-08-13 VITALS — BP 122/76 | HR 90 | Temp 97.1°F | Resp 16 | Ht 61.0 in | Wt 185.6 lb

## 2019-08-13 DIAGNOSIS — Z800 Family history of malignant neoplasm of digestive organs: Secondary | ICD-10-CM

## 2019-08-13 DIAGNOSIS — Z1151 Encounter for screening for human papillomavirus (HPV): Secondary | ICD-10-CM | POA: Diagnosis not present

## 2019-08-13 DIAGNOSIS — Z01419 Encounter for gynecological examination (general) (routine) without abnormal findings: Secondary | ICD-10-CM | POA: Diagnosis present

## 2019-08-13 DIAGNOSIS — Z3041 Encounter for surveillance of contraceptive pills: Secondary | ICD-10-CM

## 2019-08-13 MED ORDER — DESOGESTREL-ETHINYL ESTRADIOL 0.15-0.02/0.01 MG (21/5) PO TABS: 1.0000 | ORAL_TABLET | Freq: Every day | ORAL | 3 refills | Status: DC

## 2019-08-13 NOTE — Patient Instructions (Signed)

## 2019-08-14 LAB — CYTOLOGY - PAP
Comment: NEGATIVE
Diagnosis: NEGATIVE
High risk HPV: NEGATIVE

## 2020-07-22 ENCOUNTER — Other Ambulatory Visit: Payer: Self-pay

## 2020-07-22 DIAGNOSIS — Z3041 Encounter for surveillance of contraceptive pills: Secondary | ICD-10-CM

## 2020-07-22 MED ORDER — DESOGESTREL-ETHINYL ESTRADIOL 0.15-0.02/0.01 MG (21/5) PO TABS: 1.0000 | ORAL_TABLET | Freq: Every day | ORAL | 0 refills | Status: DC

## 2020-07-22 NOTE — Telephone Encounter (Signed)
Medication refill request: Viorele Last AEX:  6/22/21with BS  Next AEX: 09/02/20 with BS  Last MMG (if hormonal medication request): NA Refill authorized: #[id number] with 0 rf to get her to her next aex

## 2020-07-27 ENCOUNTER — Other Ambulatory Visit: Payer: Self-pay

## 2020-07-27 DIAGNOSIS — Z3041 Encounter for surveillance of contraceptive pills: Secondary | ICD-10-CM

## 2020-07-27 NOTE — Telephone Encounter (Signed)
Medication refill request: Kristina Mendoza  Last AEX:  08/13/19 Next AEX: 09/02/20  Last MMG (if hormonal medication request): na Refill authorized: #[id number] was given 07/22/20 RX refused for today

## 2020-08-19 ENCOUNTER — Telehealth: Payer: Self-pay | Admitting: *Deleted

## 2020-08-19 DIAGNOSIS — Z3041 Encounter for surveillance of contraceptive pills: Secondary | ICD-10-CM

## 2020-08-19 MED ORDER — DESOGESTREL-ETHINYL ESTRADIOL 0.15-0.02/0.01 MG (21/5) PO TABS: 1.0000 | ORAL_TABLET | Freq: Every day | ORAL | 1 refills | Status: DC

## 2020-08-19 NOTE — Telephone Encounter (Signed)
Patient called requesting refill on birth control pills, annual exam scheduled on 09/22/20. Rx sent.

## 2020-09-02 ENCOUNTER — Ambulatory Visit: Payer: No Typology Code available for payment source | Admitting: Obstetrics and Gynecology

## 2020-09-15 NOTE — Progress Notes (Signed)
25 y.o. G0P0000 Single African American female here for annual exam.    Shaving her pubic hair and having itching around the time of her cycle. 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   Starting a probiotic to reduce vaginal yeast infections.   Has eczema.   Starting to make changes with eating and exercise.   Having acne and is considering stopping her birth control.  She is not sexually active.  She desires STD screening.   Had a left breast infection in March and saw her PCP.   Working for a company [place] for [employer].  Does hybrid work.   PCP:  Deatra James, MD   Patient's last menstrual period was 09/16/2020 (exact date).           Sexually active: No.  The current method of family planning is OCP (estrogen/progesterone).    Exercising: Yes.     Work outs at gym Smoker:  Occ. Marijuana--weekly  Health Maintenance: Pap:  08-13-19 Neg:Neg HR HPV, 08-06-18 Neg, 11-18-14 neg History of abnormal Pap:  no MMG:  n/a Colonoscopy:  n/a BMD:   n/a  Result  n/a TDaP:  2018 Gardasil:   yes, completed HIV: 11-24-15 NR Hep C:  no Screening Labs:  declines.    reports that she has quit smoking. She has never used smokeless tobacco. She reports previous alcohol use. She reports current drug use. Frequency: 1.00 time per week. Drug: Marijuana.  Past Medical History:  Diagnosis Date   Asthma    Eczema    Seasonal allergies     Past Surgical History:  Procedure Laterality Date   BREAST REDUCTION SURGERY Bilateral 02/11/2019   Procedure: MAMMARY REDUCTION  (BREAST);  Surgeon: Louisa Second, MD;  Location: [facility];  Service: Plastics;  Laterality: Bilateral;   BREAST SURGERY  01/2019   breast reduction   WISDOM TOOTH EXTRACTION      Current Outpatient Medications  Medication Sig Dispense Refill   albuterol (PROVENTIL) (2.5 MG/3ML) 0.083% nebulizer solution USE 3 ML THREE TIMES A DAY AS NEEDED FOR ASTHMA EXACERBATION INHALATION      albuterol (VENTOLIN HFA) 108 (90 Base) MCG/ACT inhaler TAKE 2 PUFFS BY MOUTH EVERY 4 HOURS AS NEEDED     cyclobenzaprine (FLEXERIL) 10 MG tablet Take by mouth.     desogestrel-ethinyl estradiol (VIORELE) 0.15-0.02/0.01 MG (21/5) tablet Take 1 tablet by mouth daily. 28 tablet 1   fexofenadine (ALLEGRA) 180 MG tablet Take 180 mg by mouth daily.     Multiple Vitamin (MULTIVITAMIN WITH MINERALS) TABS tablet Take 1 tablet by mouth daily.     triamcinolone cream (KENALOG) 0.5 % APPLY 1 APPLICATION TO AFFECTED AREA ONCE A DAY AS NEEDED EXTERNALLY  1   triamcinolone ointment (KENALOG) 0.5 % APPLY TO AFFECTED AREA TWICE A DAY AS NEEDED     No current facility-administered medications for this visit.    Family History  Problem Relation Age of Onset   Colon cancer Mother 92       stage II/III   Alzheimer's disease Other    Hypertension Other    Diabetes Other    Colon cancer Maternal Grandmother 25   Heart disease Maternal Grandmother    Dementia Paternal Grandmother    Deafness Paternal Grandmother    Colon cancer Maternal Uncle 60       stage II great uncle   Other Maternal Uncle        heart problems under age 40  Review of Systems  Skin:        acne  All other systems reviewed and are negative.  Exam:   BP (!) 140/98   Pulse 84   Ht 5\' 1"  (1.549 m)   Wt 199 lb (90.3 kg)   LMP 09/16/2020 (Exact Date)   SpO2 98%   BMI 37.60 kg/m     General appearance: alert, cooperative and appears stated age Head: normocephalic, without obvious abnormality, atraumatic Neck: no adenopathy, supple, symmetrical, trachea midline and thyroid normal to inspection and palpation Lungs: clear to auscultation bilaterally Breasts: consistent with reduction, no masses or tenderness, No nipple retraction or dimpling, No nipple discharge or bleeding, No axillary adenopathy Heart: regular rate and rhythm Abdomen: soft, non-tender; no masses, no organomegaly Extremities: extremities normal, atraumatic, no  cyanosis or edema Skin: skin color, texture, turgor normal. No rashes or lesions Lymph nodes: cervical, supraclavicular, and axillary nodes normal. Neurologic: grossly normal  Pelvic: External genitalia:  scarring of the vulva.                No abnormal inguinal nodes palpated.              Urethra:  normal appearing urethra with no masses, tenderness or lesions              Bartholins and Skenes: normal                 Vagina: normal appearing vagina with normal color and discharge, no lesions              Cervix: no lesions              Pap taken: no Bimanual Exam:  Uterus:  normal size, contour, position, consistency, mobility, non-tender              Adnexa: no mass, fullness, tenderness              Rectal exam: yes  Confirms.              Anus:  normal sphincter tone, no lesions  Chaperone was present for exam:  09/18/2020, CMA.  Assessment:   Well woman visit with gynecologic exam. Status post bilateral breast reduction. Strong FH colon cancer.   She has been advised in the past to start colonoscopy at age 14. On COCs.  Elevated blood pressure.  Probable hidradenitis suppurativa.  Plan: Mammogram screening starting age 39.  Self breast awareness reviewed. Pap and HR HPV as above. Guidelines for Calcium, Vitamin D, regular exercise program including cardiovascular and weight bearing exercise. Stop birth control, both patient and I agree.  If she wants to restart a pill, I would have her consider Yasmin if her blood pressure is normal.  STD screening.  She declines genetic counseling and testing.  We discussed hidradenitis and potential treatments.  She will see her PCP for a blood pressure recheck and routine visit. Follow up annually and prn.   After visit summary provided.

## 2020-09-22 ENCOUNTER — Other Ambulatory Visit: Payer: Self-pay

## 2020-09-22 ENCOUNTER — Other Ambulatory Visit (HOSPITAL_COMMUNITY)
Admission: RE | Admit: 2020-09-22 | Discharge: 2020-09-22 | Disposition: A | Discharge status: Home / Self Care | Payer: Managed Care, Other (non HMO) | Source: Ambulatory Visit | Attending: Obstetrics and Gynecology | Admitting: Obstetrics and Gynecology

## 2020-09-22 ENCOUNTER — Encounter: Payer: Self-pay | Admitting: Obstetrics and Gynecology

## 2020-09-22 ENCOUNTER — Ambulatory Visit (INDEPENDENT_AMBULATORY_CARE_PROVIDER_SITE_OTHER): Payer: 59 | Admitting: Obstetrics and Gynecology

## 2020-09-22 VITALS — BP 140/98 | HR 84 | Ht 61.0 in | Wt 199.0 lb

## 2020-09-22 DIAGNOSIS — Z113 Encounter for screening for infections with a predominantly sexual mode of transmission: Secondary | ICD-10-CM | POA: Diagnosis not present

## 2020-09-22 DIAGNOSIS — Z01419 Encounter for gynecological examination (general) (routine) without abnormal findings: Secondary | ICD-10-CM | POA: Diagnosis not present

## 2020-09-22 NOTE — Patient Instructions (Signed)
Hidradenitis Suppurativa Hidradenitis suppurativa is a long-term (chronic) skin disease. It is similar to a severe form of acne, but it affects areas of the body where acne would be unusual, especially areas of the body where skin rubs against skin and becomes moist. These include: Underarms. Groin. Genital area. Buttocks. Upper thighs. Breasts. Hidradenitis suppurativa may start out as small lumps or pimples caused by blocked sweat glands or hair follicles. Pimples may develop into deep sores that break open (rupture) and drain pus. Over time, affected areas of skin may thicken and become scarred. This condition is rare and does not spread from person to person (non-contagious). What are the causes? The exact cause of this condition is not known. It may be related to: Female and female hormones. An overactive disease-fighting system (immune system). The immune system may over-react to blocked hair follicles or sweat glands and cause swelling and pus-filled sores. What increases the risk? You are more likely to develop this condition if you: Are female. Are 22-21 years old. Have a family history of hidradenitis suppurativa. Have a personal history of acne. Are overweight. Smoke. Take the medicine lithium. What are the signs or symptoms? The first symptoms are usually painful bumps in the skin, similar to pimples. The condition may get worse over time (progress), or it may only cause mild symptoms. If the disease progresses, symptoms may include: Skin bumps getting bigger and growing deeper into the skin. Bumps rupturing and draining pus. Itchy, infected skin. Skin getting thicker and scarred. Tunnels under the skin (fistulas) where pus drains from a bump. Pain during daily activities, such as pain during walking if your groin area is affected. Emotional problems, such as stress or depression. This condition may affect your appearance and your ability or willingness to wear certain clothes  or do certain activities. How is this diagnosed? This condition is diagnosed by a health care provider who specializes in skin diseases (dermatologist). You may be diagnosed based on: Your symptoms and medical history. A physical exam. Testing a pus sample for infection. Blood tests. How is this treated? Your treatment will depend on how severe your symptoms are. The same treatment will not work for everybody with this condition. You may need to try several treatments to find what works best for you. Treatment may include: Cleaning and bandaging (dressing) your wounds as needed. Lifestyle changes, such as new skin care routines. Taking medicines, such as: Antibiotics. Acne medicines. Medicines to reduce the activity of the immune system. A diabetes medicine (metformin). Birth control pills, for women. Steroids to reduce swelling and pain. Working with a mental health care provider, if you experience emotional distress due to this condition. If you have severe symptoms that do not get better with medicine, you may need surgery. Surgery may involve: Using a laser to clear the skin and remove hair follicles. Opening and draining deep sores. Removing the areas of skin that are diseased and scarred. Follow these instructions at home: Medicines  Take over-the-counter and prescription medicines only as told by your health care provider. If you were prescribed an antibiotic medicine, take it as told by your health care provider. Do not stop taking the antibiotic even if your condition improves.  Skin care If you have open wounds, cover them with a clean dressing as told by your health care provider. Keep wounds clean by washing them gently with soap and water when you bathe. Do not shave the areas where you get hidradenitis suppurativa. Do not wear deodorant. Wear  loose-fitting clothes. Try to avoid getting overheated or sweaty. If you get sweaty or wet, change into clean, dry clothes as  soon as you can. To help relieve pain and itchiness, cover sore areas with a warm, clean washcloth (warm compress) for 5-10 minutes as often as needed. If told by your health care provider, take a bleach bath twice a week: Fill your bathtub halfway with water. Pour in  cup of unscented household bleach. Soak in the tub for 5-10 minutes. Only soak from the neck down. Avoid water on your face and hair. Shower to rinse off the bleach from your skin. General instructions Learn as much as you can about your disease so that you have an active role in your treatment. Work closely with your health care provider to find treatments that work for you. If you are overweight, work with your health care provider to lose weight as recommended. Do not use any products that contain nicotine or tobacco, such as cigarettes and e-cigarettes. If you need help quitting, ask your health care provider. If you struggle with living with this condition, talk with your health care provider or work with a mental health care provider as recommended. Keep all follow-up visits as told by your health care provider. This is important. Where to find more information Hidradenitis Suppurativa Foundation, Inc.: https://www.hs-foundation.org/ American Academy of Dermatology: InstantFinish.fi Contact a health care provider if you have: A flare-up of hidradenitis suppurativa. A fever or chills. Trouble controlling your symptoms at home. Trouble doing your daily activities because of your symptoms. Trouble dealing with emotional problems related to your condition. Summary Hidradenitis suppurativa is a long-term (chronic) skin disease. It is similar to a severe form of acne, but it affects areas of the body where acne would be unusual. The first symptoms are usually painful bumps in the skin, similar to pimples. The condition may only cause mild symptoms, or it may get worse over time (progress). If you have open wounds, cover  them with a clean dressing as told by your health care provider. Keep wounds clean by washing them gently with soap and water when you bathe. Besides skin care, treatment may include medicines, laser treatment, and surgery. This information is not intended to replace advice given to you by your health care provider. Make sure you discuss any questions you have with your healthcare provider. Document Revised: 12/03/2019 Document Reviewed: 12/03/2019 Elsevier Patient Education  2022 Elsevier Inc.  EXERCISE AND DIET:  We recommended that you start or continue a regular exercise program for good health. Regular exercise means any activity that makes your heart beat faster and makes you sweat.  We recommend exercising at least 30 minutes per day at least 3 days a week, preferably 4 or 5.  We also recommend a diet low in fat and sugar.  Inactivity, poor dietary choices and obesity can cause diabetes, heart attack, stroke, and kidney damage, among others.    ALCOHOL AND SMOKING:  Women should limit their alcohol intake to no more than 7 drinks/beers/glasses of wine (combined, not each!) per week. Moderation of alcohol intake to this level decreases your risk of breast cancer and liver damage. And of course, no recreational drugs are part of a healthy lifestyle.  And absolutely no smoking or even second hand smoke. Most people know smoking can cause heart and lung diseases, but did you know it also contributes to weakening of your bones? Aging of your skin?  Yellowing of your teeth and nails?  CALCIUM  AND VITAMIN D:  Adequate intake of calcium and Vitamin D are recommended.  The recommendations for exact amounts of these supplements seem to change often, but generally speaking 600 mg of calcium (either carbonate or citrate) and 800 units of Vitamin D per day seems prudent. Certain women may benefit from higher intake of Vitamin D.  If you are among these women, your doctor will have told you during your visit.     PAP SMEARS:  Pap smears, to check for cervical cancer or precancers,  have traditionally been done yearly, although recent scientific advances have shown that most women can have pap smears less often.  However, every woman still should have a physical exam from her gynecologist every year. It will include a breast check, inspection of the vulva and vagina to check for abnormal growths or skin changes, a visual exam of the cervix, and then an exam to evaluate the size and shape of the uterus and ovaries.  And after 27 years of age, a rectal exam is indicated to check for rectal cancers. We will also provide age appropriate advice regarding health maintenance, like when you should have certain vaccines, screening for sexually transmitted diseases, bone density testing, colonoscopy, mammograms, etc.   MAMMOGRAMS:  All women over 70 years old should have a yearly mammogram. Many facilities now offer a "3D" mammogram, which may cost around $50 extra out of pocket. If possible,  we recommend you accept the option to have the 3D mammogram performed.  It both reduces the number of women who will be called back for extra views which then turn out to be normal, and it is better than the routine mammogram at detecting truly abnormal areas.    COLONOSCOPY:  Colonoscopy to screen for colon cancer is recommended for all women at age 70.  We know, you hate the idea of the prep.  We agree, BUT, having colon cancer and not knowing it is worse!!  Colon cancer so often starts as a polyp that can be seen and removed at colonscopy, which can quite literally save your life!  And if your first colonoscopy is normal and you have no family history of colon cancer, most women don't have to have it again for 10 years.  Once every ten years, you can do something that may end up saving your life, right?  We will be happy to help you get it scheduled when you are ready.  Be sure to check your insurance coverage so you understand how  much it will cost.  It may be covered as a preventative service at no cost, but you should check your particular policy.

## 2020-09-23 LAB — CERVICOVAGINAL ANCILLARY ONLY
Chlamydia: NEGATIVE
Comment: NEGATIVE
Comment: NEGATIVE
Comment: NORMAL
Neisseria Gonorrhea: NEGATIVE
Trichomonas: NEGATIVE

## 2020-09-23 LAB — HEPATITIS B SURFACE ANTIGEN: Hepatitis B Surface Ag: NONREACTIVE

## 2020-09-23 LAB — HEPATITIS C ANTIBODY
Hepatitis C Ab: NONREACTIVE
SIGNAL TO CUT-OFF: 0.01 (ref <1.00)

## 2020-09-23 LAB — HIV ANTIBODY (ROUTINE TESTING W REFLEX): HIV 1&2 Ab, 4th Generation: NONREACTIVE

## 2020-09-23 LAB — RPR: RPR Ser Ql: NONREACTIVE

## 2021-09-16 NOTE — Progress Notes (Signed)
26 y.o. G0P0000 Single African American female here for annual exam.    Stopped birth control last year.  Cycles are normal off the pills.   Not sexually active in the last year.  Declines STD testing.   PCP:  Deatra James, MD   Patient's last menstrual period was 08/27/2021 (exact date).     Period Cycle (Days): 30 Period Duration (Days): 4-5 Period Pattern: Regular Menstrual Flow: Moderate Menstrual Control: Maxi pad Menstrual Control Change Freq (Hours): changes maxi pad 3-4 x/day Dysmenorrhea: (!) Mild Dysmenorrhea Symptoms: Cramping     Sexually active: No.  The current method of family planning is Abstinence Exercising: Yes.     Volleyball, gym workouts, roller skating Smoker:  occ. marijuana  Health Maintenance: Pap: 08-13-19 Neg:Neg HR HPV, 08-06-18 Neg, 11-18-14 neg History of abnormal Pap:  no MMG:  n/a Colonoscopy:  n/a BMD:   n/a  Result  n/a TDaP:  2018 Gardasil:   Completed HIV: 09-22-20 NR Hep C: 09-22-20 Neg Screening Labs:  PCP   reports that she has quit smoking. She has never used smokeless tobacco. She reports that she does not currently use alcohol. She reports current drug use. Frequency: 1.00 time per week. Drug: Marijuana.  Past Medical History:  Diagnosis Date   Asthma    Eczema    Seasonal allergies     Past Surgical History:  Procedure Laterality Date   BREAST REDUCTION SURGERY Bilateral 02/11/2019   Procedure: MAMMARY REDUCTION  (BREAST);  Surgeon: Louisa Second, MD;  Location: [facility];  Service: Plastics;  Laterality: Bilateral;   BREAST SURGERY  01/2019   breast reduction   WISDOM TOOTH EXTRACTION      Current Outpatient Medications  Medication Sig Dispense Refill   albuterol (PROVENTIL) (2.5 MG/3ML) 0.083% nebulizer solution USE 3 ML THREE TIMES A DAY AS NEEDED FOR ASTHMA EXACERBATION INHALATION     albuterol (VENTOLIN HFA) 108 (90 Base) MCG/ACT inhaler TAKE 2 PUFFS BY MOUTH EVERY 4 HOURS AS NEEDED      cyclobenzaprine (FLEXERIL) 10 MG tablet Take by mouth.     fexofenadine (ALLEGRA) 180 MG tablet Take 180 mg by mouth daily.     fluticasone (FLONASE) 50 MCG/ACT nasal spray 1 spray in each nostril     Multiple Vitamin (MULTIVITAMIN WITH MINERALS) TABS tablet Take 1 tablet by mouth daily.     triamcinolone cream (KENALOG) 0.5 % APPLY 1 APPLICATION TO AFFECTED AREA ONCE A DAY AS NEEDED EXTERNALLY  1   triamcinolone ointment (KENALOG) 0.5 % APPLY TO AFFECTED AREA TWICE A DAY AS NEEDED     No current facility-administered medications for this visit.    Family History  Problem Relation Age of Onset   Colon cancer Mother 53       stage II/III   Alzheimer's disease Other    Hypertension Other    Diabetes Other    Colon cancer Maternal Grandmother 27   Heart disease Maternal Grandmother    Dementia Paternal Grandmother    Deafness Paternal Grandmother    Colon cancer Maternal Uncle 60       stage II great uncle   Other Maternal Uncle        heart problems under age 53    Review of Systems  All other systems reviewed and are negative.   Exam:   BP (!) 166/98   Pulse (!) 107   Ht 5\' 2"  (1.575 m)   Wt 200 lb (90.7 kg)   LMP 08/27/2021 (Exact  Date)   SpO2 98%   BMI 36.58 kg/m     General appearance: alert, cooperative and appears stated age Head: normocephalic, without obvious abnormality, atraumatic Neck: no adenopathy, supple, symmetrical, trachea midline and thyroid normal to inspection and palpation Lungs: clear to auscultation bilaterally Breasts: normal appearance, no masses or tenderness, No nipple retraction or dimpling, No nipple discharge or bleeding, No axillary adenopathy Heart: regular rate and rhythm Abdomen: soft, non-tender; no masses, no organomegaly Extremities: extremities normal, atraumatic, no cyanosis or edema Skin: skin color, texture, turgor normal. No rashes or lesions Lymph nodes: cervical, supraclavicular, and axillary nodes normal. Neurologic: grossly  normal  Pelvic: External genitalia:  no lesions              No abnormal inguinal nodes palpated.              Urethra:  normal appearing urethra with no masses, tenderness or lesions              Bartholins and Skenes: normal                 Vagina: normal appearing vagina with normal color and discharge, no lesions              Cervix: no lesions              Pap taken: no Bimanual Exam:  Uterus:  normal size, contour, position, consistency, mobility, non-tender              Adnexa: no mass, fullness, tenderness              Rectal exam: yes.  Confirms.              Anus:  normal sphincter tone, no lesions  Chaperone was present for exam:  Marchelle Folks, CMA  Assessment:   Well woman visit with gynecologic exam. Status post bilateral breast reduction. Strong FH colon cancer.   She has been advised in the past to start colonoscopy at age 47. Elevated blood pressure.   Plan: Mammogram screening discussed. Self breast awareness reviewed. Pap and HR HPV 2024. Guidelines for Calcium, Vitamin D, regular exercise program including cardiovascular and weight bearing exercise. We discussed condoms use and Plan B if needed.  Declines genetic counseling at this time. She will see her PCP regarding elevated blood pressure.  She will monitor once a day at home and bring to her appt with her PCP.  Follow up annually and prn.   After visit summary provided.

## 2021-09-23 ENCOUNTER — Ambulatory Visit (INDEPENDENT_AMBULATORY_CARE_PROVIDER_SITE_OTHER): Payer: 59 | Admitting: Obstetrics and Gynecology

## 2021-09-23 ENCOUNTER — Encounter: Payer: Self-pay | Admitting: Obstetrics and Gynecology

## 2021-09-23 VITALS — BP 166/98 | HR 107 | Ht 62.0 in | Wt 200.0 lb

## 2021-09-23 DIAGNOSIS — Z01419 Encounter for gynecological examination (general) (routine) without abnormal findings: Secondary | ICD-10-CM | POA: Diagnosis not present

## 2021-09-23 NOTE — Patient Instructions (Signed)

## 2023-09-05 ENCOUNTER — Ambulatory Visit: Payer: 59 | Admitting: Obstetrics and Gynecology

## 2023-10-26 NOTE — Progress Notes (Signed)
 28 y.o. G4P0000 female with history of prior bilateral breast reduction, fam hx of colon cancer (followed by GI, recommend screening at 30yo) here for annual exam. Single. 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 PCP:  Vyvyan Sun, MD   Patient's last menstrual period was 10/03/2023 (approximate). Period Cycle (Days): 28 Period Duration (Days): 4 Period Pattern: Regular Menstrual Flow: Moderate Menstrual Control: Maxi pad Dysmenorrhea: (!) Mild  She reports hormonal acne, heat intolerance around per cycle. Using supplements for symptoms. Working to improve diet. Stopped COC a few years ago. Declined genetic testing previously, considering now Urine sample provided: No  Abnormal bleeding: none Pelvic discharge or pain: none Breast mass, nipple discharge or skin changes : none  Sexually active: Not currently  Birth control: None Last PAP:     Component Value Date/Time   DIAGPAP  08/13/2019 1143    - Negative for intraepithelial lesion or malignancy (NILM)   DIAGPAP  08/06/2018 0000    NEGATIVE FOR INTRAEPITHELIAL LESIONS OR MALIGNANCY.   HPVHIGH Negative 08/13/2019 1143   ADEQPAP  08/13/2019 1143    Satisfactory for evaluation; transformation zone component PRESENT.   ADEQPAP  08/06/2018 0000    Satisfactory for evaluation  endocervical/transformation zone component PRESENT.   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 [facility]  PHQ-2 Total Score 0      GYN HISTORY: No sig hx  OB History  Gravida Para Term Preterm AB Living  0 0 0 0 0 0  SAB IAB Ectopic Multiple Live Births  0 0 0 0 0   Past Medical History:  Diagnosis Date   Asthma    Eczema    Seasonal allergies    Past Surgical History:  Procedure Laterality Date   BREAST REDUCTION SURGERY Bilateral 02/11/2019   Procedure: MAMMARY REDUCTION  (BREAST);   Surgeon: Marcus Lung, MD;  Location: [facility];  Service: Plastics;  Laterality: Bilateral;   BREAST SURGERY  01/2019   breast reduction   WISDOM TOOTH EXTRACTION     Current Outpatient Medications on File Prior to Visit  Medication Sig Dispense Refill   albuterol  (PROVENTIL ) (2.5 MG/3ML) 0.083% nebulizer solution USE 3 ML THREE TIMES A DAY AS NEEDED FOR ASTHMA EXACERBATION INHALATION     albuterol  (VENTOLIN  HFA) 108 (90 Base) MCG/ACT inhaler TAKE 2 PUFFS BY MOUTH EVERY 4 HOURS AS NEEDED     cyclobenzaprine (FLEXERIL) 10 MG tablet Take by mouth.     fexofenadine (ALLEGRA) 180 MG tablet Take 180 mg by mouth daily.     fluticasone (FLONASE) 50 MCG/ACT nasal spray 1 spray in each nostril     Misc Natural Products (BEET ROOT) 500 MG CAPS as directed Orally     Multiple Vitamin (MULTIVITAMIN WITH MINERALS) TABS tablet Take 1 tablet by mouth daily.     Probiotic Product (ALIGN) 10 MG CAPS as directed Orally     triamcinolone cream (KENALOG) 0.5 % APPLY 1 APPLICATION TO AFFECTED AREA ONCE A DAY AS NEEDED EXTERNALLY  1   triamcinolone ointment (KENALOG) 0.5 % APPLY TO AFFECTED AREA TWICE A DAY AS NEEDED     No current facility-administered medications on file prior to visit.   Social History   Socioeconomic History   Marital status: Single    Spouse name: Not on file   Number of children: 0   Years of education: Not on  file   Highest education level: Not on file  Occupational History   Not on file  Tobacco Use   Smoking status: Never   Smokeless tobacco: Never  Vaping Use   Vaping status: Never Used  Substance and Sexual Activity   Alcohol use: Not Currently    Comment: socially   Drug use: Yes    Frequency: 1.0 times per week    Types: Marijuana   Sexual activity: Not Currently    Partners: Male    Birth control/protection: None  Other Topics Concern   Not on file  Social History Narrative   Not on file   Social Drivers of Health   Financial Resource  Strain: Not on file  Food Insecurity: Not on file  Transportation Needs: Not on file  Physical Activity: Not on file  Stress: Not on file  Social Connections: Not on file  Intimate Partner Violence: Not on file   Family History  Problem Relation Age of Onset   Colon cancer Mother 4       stage II/III   Alzheimer's disease Other    Hypertension Other    Diabetes Other    Colon cancer Maternal Grandmother 62   Heart disease Maternal Grandmother    Dementia Paternal Grandmother    Deafness Paternal Grandmother    Colon cancer Maternal Uncle 60       stage II great uncle   Other Maternal Uncle        heart problems under age 73   Allergies  Allergen Reactions   Amoxicillin Hives   Penicillins    Other Rash   Oxycodone  Rash and Other (See Comments)    hallucinations    PE Today's Vitals   10/30/23 0925  BP: (!) 142/80  Pulse: 96  Temp: 98.4 F (36.9 C)  TempSrc: Oral  SpO2: 99%  Weight: 205 lb (93 kg)  Height: 5' 2.25 (1.581 m)   Body mass index is 37.19 kg/m.  Physical Exam Vitals reviewed. Exam conducted with a chaperone present.  Constitutional:      General: She is not in acute distress.    Appearance: Normal appearance.  HENT:     Head: Normocephalic and atraumatic.     Nose: Nose normal.  Eyes:     Extraocular Movements: Extraocular movements intact.     Conjunctiva/sclera: Conjunctivae normal.  Neck:     Thyroid: No thyroid mass, thyromegaly or thyroid tenderness.  Pulmonary:     Effort: Pulmonary effort is normal.  Chest:     Chest wall: No mass or tenderness.  Breasts:    Right: Normal. No swelling, mass, nipple discharge, skin change or tenderness.     Left: Normal. No swelling, mass, nipple discharge, skin change or tenderness.  Abdominal:     General: There is no distension.     Palpations: Abdomen is soft.     Tenderness: There is no abdominal tenderness.  Genitourinary:    General: Normal vulva.     Exam position: Lithotomy position.      Urethra: No prolapse.     Vagina: Normal. No vaginal discharge or bleeding.     Cervix: Normal. No lesion.     Uterus: Normal. Not enlarged and not tender.      Adnexa: Right adnexa normal and left adnexa normal.     Comments: Mild scaring from prior abscess present Musculoskeletal:        General: Normal range of motion.     Cervical back: Normal range  of motion.  Lymphadenopathy:     Upper Body:     Right upper body: No axillary adenopathy.     Left upper body: No axillary adenopathy.     Lower Body: No right inguinal adenopathy. No left inguinal adenopathy.  Skin:    General: Skin is warm and dry.  Neurological:     General: No focal deficit present.     Mental Status: She is alert.  Psychiatric:        Mood and Affect: Mood normal.        Behavior: Behavior normal.      Assessment and Plan:        Well woman exam with routine gynecological exam Assessment & Plan: Cervical cancer screening performed according to ASCCP guidelines. Colonoscopy, follows with GI, due at 28yo  Labs and immunizations with her primary Encouraged safe sexual practices as indicated Encouraged healthy lifestyle practices with diet and exercise For patients under 50yo, I recommend 1000mg  calcium daily and 600IU of vitamin D daily.    Negative depression screening  Family history of colon cancer Assessment & Plan: Follows with GI   Vera LULLA Pa, MD

## 2023-10-30 ENCOUNTER — Encounter: Payer: Self-pay | Admitting: Obstetrics and Gynecology

## 2023-10-30 ENCOUNTER — Ambulatory Visit (INDEPENDENT_AMBULATORY_CARE_PROVIDER_SITE_OTHER): Admitting: Obstetrics and Gynecology

## 2023-10-30 VITALS — BP 142/80 | HR 96 | Temp 98.4°F | Ht 62.25 in | Wt 205.0 lb

## 2023-10-30 DIAGNOSIS — Z800 Family history of malignant neoplasm of digestive organs: Secondary | ICD-10-CM | POA: Diagnosis not present

## 2023-10-30 DIAGNOSIS — Z1331 Encounter for screening for depression: Secondary | ICD-10-CM

## 2023-10-30 DIAGNOSIS — Z01419 Encounter for gynecological examination (general) (routine) without abnormal findings: Secondary | ICD-10-CM | POA: Insufficient documentation

## 2023-10-30 NOTE — Patient Instructions (Signed)

## 2023-10-30 NOTE — Assessment & Plan Note (Signed)
 Cervical cancer screening performed according to ASCCP guidelines. Colonoscopy, follows with GI, due at 28yo  Labs and immunizations with her primary Encouraged safe sexual practices as indicated Encouraged healthy lifestyle practices with diet and exercise For patients under 50yo, I recommend 1000mg  calcium daily and 600IU of vitamin D daily.

## 2023-10-30 NOTE — Assessment & Plan Note (Signed)
 Follows with GI

## 2024-10-30 ENCOUNTER — Ambulatory Visit: Admitting: Obstetrics and Gynecology
